# Patient Record
Sex: Female | Born: 1970 | Race: Black or African American | Hispanic: No | Marital: Single | Smoking: Never smoker
Health system: Southern US, Community
[De-identification: ages and names within clinical notes are randomized; demographics above are authoritative.]

## PROBLEM LIST (undated history)

## (undated) DIAGNOSIS — D509 Iron deficiency anemia, unspecified: Secondary | ICD-10-CM

## (undated) DIAGNOSIS — G589 Mononeuropathy, unspecified: Secondary | ICD-10-CM

## (undated) DIAGNOSIS — I10 Essential (primary) hypertension: Secondary | ICD-10-CM

## (undated) DIAGNOSIS — K219 Gastro-esophageal reflux disease without esophagitis: Secondary | ICD-10-CM

## (undated) DIAGNOSIS — E119 Type 2 diabetes mellitus without complications: Secondary | ICD-10-CM

## (undated) HISTORY — DX: Type 2 diabetes mellitus without complications: E11.9

## (undated) HISTORY — DX: Mononeuropathy, unspecified: G58.9

## (undated) HISTORY — DX: Gastro-esophageal reflux disease without esophagitis: K21.9

## (undated) HISTORY — DX: Iron deficiency anemia, unspecified: D50.9

## (undated) HISTORY — DX: Essential (primary) hypertension: I10

## (undated) HISTORY — PX: OOPHORECTOMY: SHX86

---

## 2014-12-15 ENCOUNTER — Other Ambulatory Visit: Payer: Self-pay | Admitting: Family Medicine

## 2014-12-15 DIAGNOSIS — R221 Localized swelling, mass and lump, neck: Secondary | ICD-10-CM

## 2014-12-20 ENCOUNTER — Ambulatory Visit
Admission: RE | Admit: 2014-12-20 | Discharge: 2014-12-20 | Disposition: A | Discharge status: Home / Self Care | Payer: BLUE CROSS/BLUE SHIELD | Source: Ambulatory Visit | Attending: Family Medicine | Admitting: Family Medicine

## 2014-12-20 DIAGNOSIS — R221 Localized swelling, mass and lump, neck: Secondary | ICD-10-CM

## 2014-12-28 ENCOUNTER — Other Ambulatory Visit: Payer: Self-pay | Admitting: Family Medicine

## 2014-12-28 DIAGNOSIS — E041 Nontoxic single thyroid nodule: Secondary | ICD-10-CM

## 2014-12-29 ENCOUNTER — Ambulatory Visit
Admission: RE | Admit: 2014-12-29 | Discharge: 2014-12-29 | Disposition: A | Discharge status: Home / Self Care | Payer: BLUE CROSS/BLUE SHIELD | Source: Ambulatory Visit | Attending: Family Medicine | Admitting: Family Medicine

## 2014-12-29 ENCOUNTER — Other Ambulatory Visit (HOSPITAL_COMMUNITY)
Admission: RE | Admit: 2014-12-29 | Discharge: 2014-12-29 | Disposition: A | Discharge status: Home / Self Care | Payer: BLUE CROSS/BLUE SHIELD | Source: Ambulatory Visit | Attending: Diagnostic Radiology | Admitting: Diagnostic Radiology

## 2014-12-29 DIAGNOSIS — E041 Nontoxic single thyroid nodule: Secondary | ICD-10-CM | POA: Diagnosis not present

## 2015-01-18 ENCOUNTER — Other Ambulatory Visit: Payer: Self-pay | Admitting: Surgery

## 2015-01-18 DIAGNOSIS — E041 Nontoxic single thyroid nodule: Secondary | ICD-10-CM

## 2015-07-12 ENCOUNTER — Other Ambulatory Visit: Payer: Self-pay | Admitting: Surgery

## 2015-07-12 DIAGNOSIS — E042 Nontoxic multinodular goiter: Secondary | ICD-10-CM

## 2015-07-19 ENCOUNTER — Other Ambulatory Visit: Payer: BLUE CROSS/BLUE SHIELD

## 2016-01-24 IMAGING — US US THYROID BIOPSY
1 series · 11 of 11 positions shown · non-contrast
Comparison: 12/20/2014

CLINICAL DATA: 43-year-old with a dominant right thyroid nodule.

EXAM:
ULTRASOUND GUIDED NEEDLE ASPIRATE BIOPSY OF THE THYROID GLAND

[Series 1: us thyroid biopsy · 0.08mm/px · 11 acquisitions, 11 frames shown]
[im 1/11]
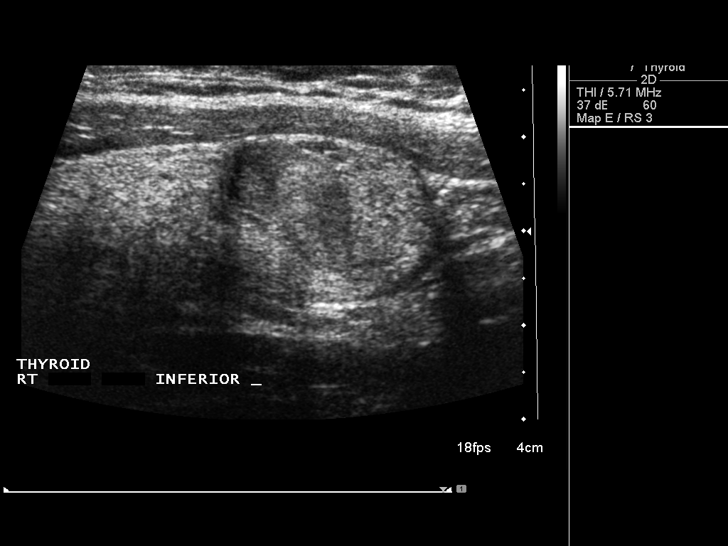
[im 2/11]
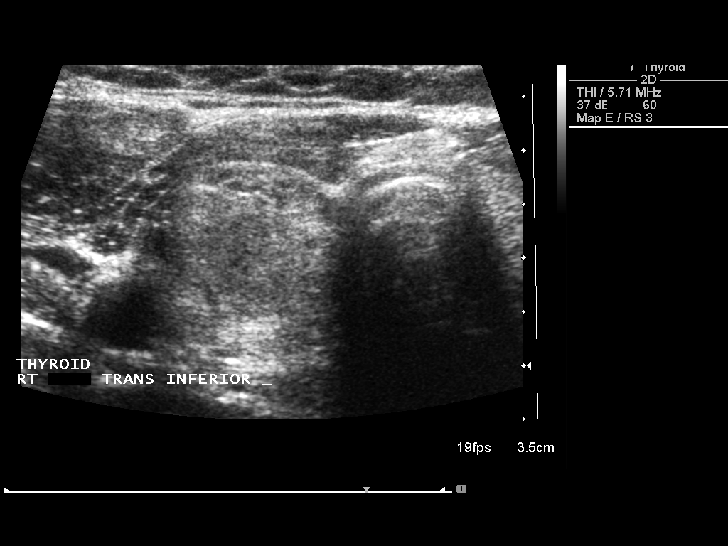
[im 3/11]
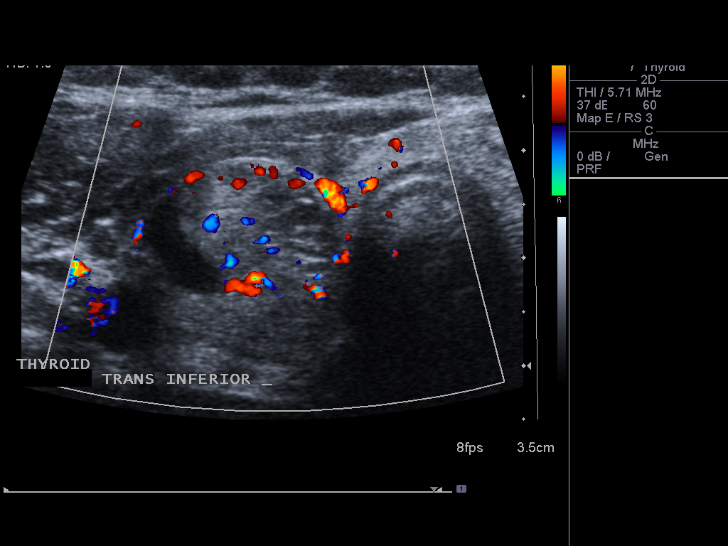
[im 4/11]
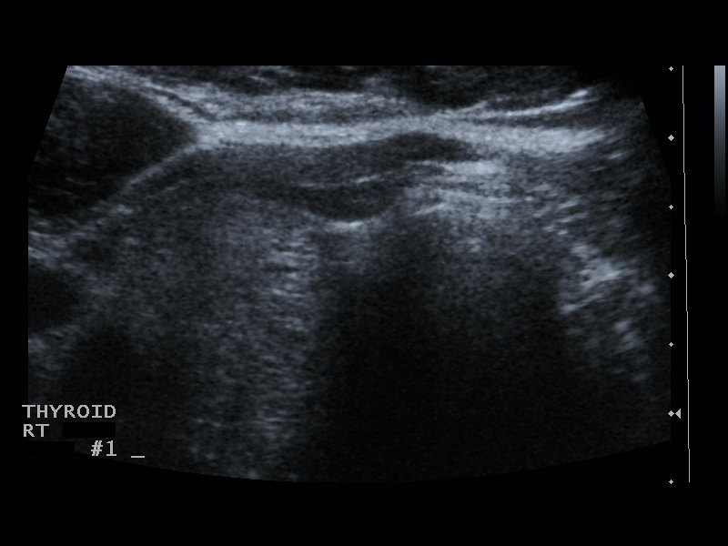
[im 5/11]
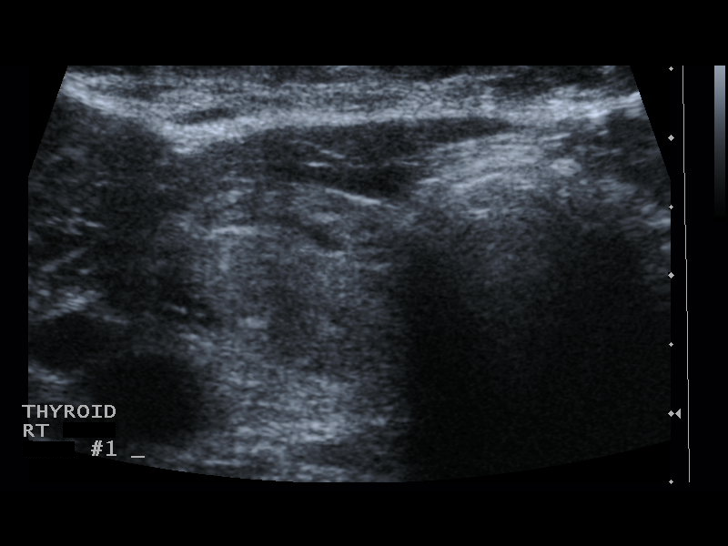
[im 6/11]
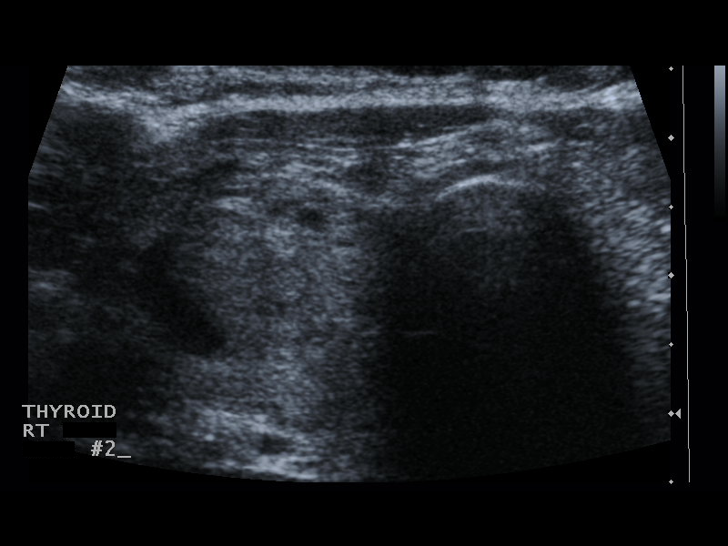
[im 7/11]
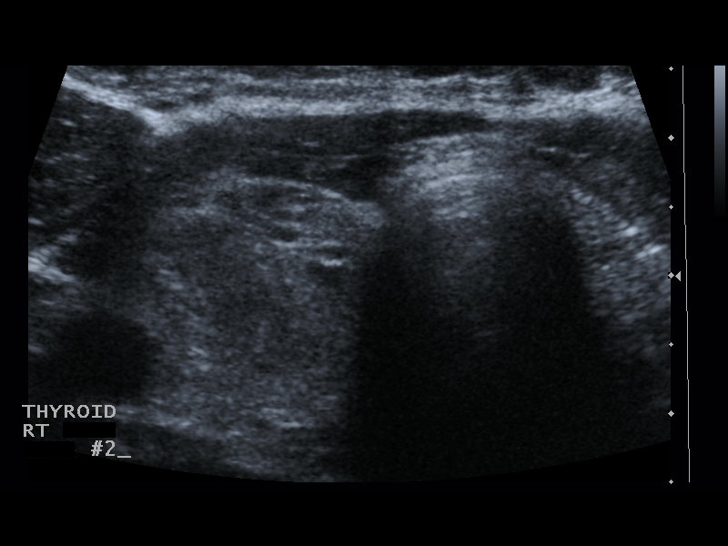
[im 8/11]
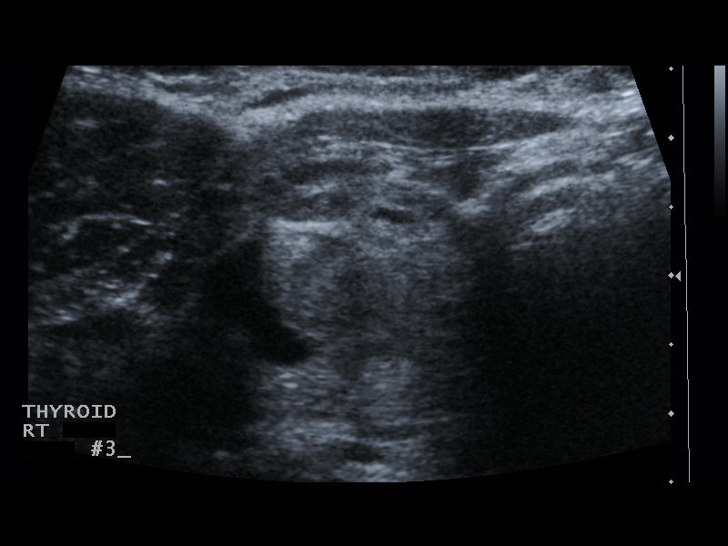
[im 9/11]
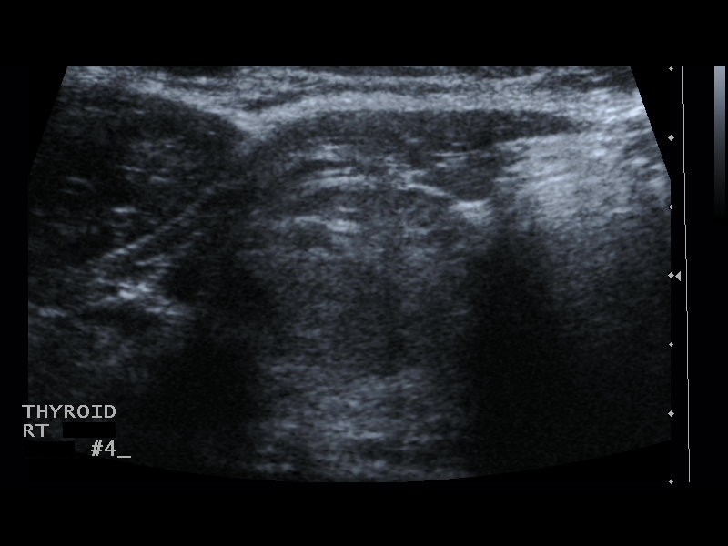
[im 10/11]
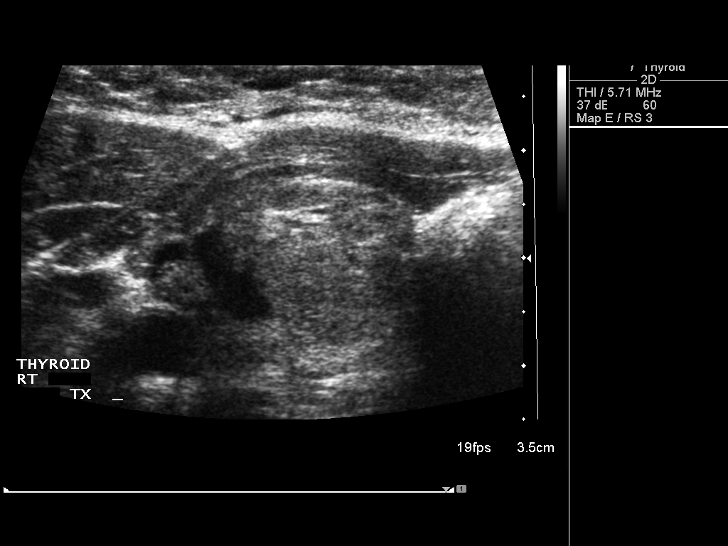
[im 11/11]
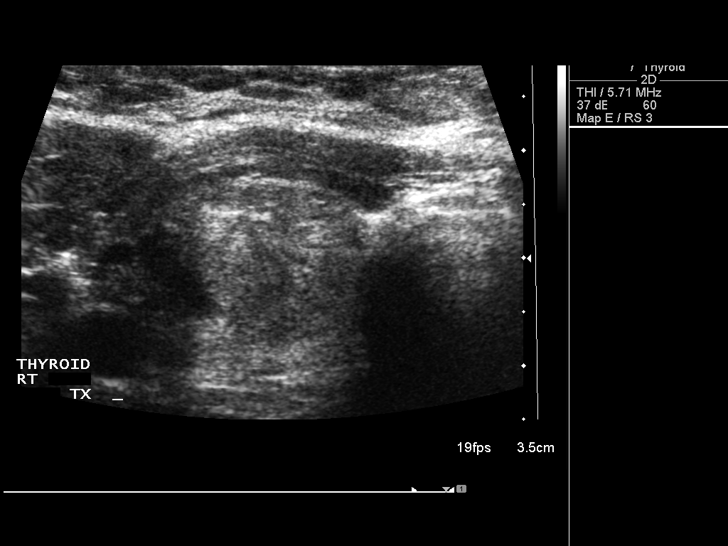

[11 of 11 positions shown; findings below may reference images not displayed]

PROCEDURE:
Thyroid biopsy was thoroughly discussed with the patient and
questions were answered. The benefits, risks, alternatives, and
complications were also discussed. The patient understands and
wishes to proceed with the procedure. Written consent was obtained.

Ultrasound was performed to localize and mark an adequate site for
the biopsy. The patient was then prepped and draped in a normal
sterile fashion. Local anesthesia was provided with 1% lidocaine.
Using direct ultrasound guidance, 4 passes were made using needles
into the nodule within the right lobe of the thyroid. Ultrasound was
used to confirm needle placements on all occasions. Specimens were
sent to Pathology for analysis.

Complications:  None

Estimated blood loss: Minimal
FINDINGS: Heterogeneous dominant nodule in the inferior right thyroid lobe.
IMPRESSION: Ultrasound guided needle aspirate biopsy performed of the right
dominant thyroid nodule.

## 2016-07-19 DIAGNOSIS — I10 Essential (primary) hypertension: Secondary | ICD-10-CM | POA: Diagnosis not present

## 2016-07-19 DIAGNOSIS — E559 Vitamin D deficiency, unspecified: Secondary | ICD-10-CM | POA: Diagnosis not present

## 2016-07-19 DIAGNOSIS — E1169 Type 2 diabetes mellitus with other specified complication: Secondary | ICD-10-CM | POA: Diagnosis not present

## 2016-07-19 DIAGNOSIS — D649 Anemia, unspecified: Secondary | ICD-10-CM | POA: Diagnosis not present

## 2016-08-22 DIAGNOSIS — J029 Acute pharyngitis, unspecified: Secondary | ICD-10-CM | POA: Diagnosis not present

## 2016-08-22 DIAGNOSIS — J101 Influenza due to other identified influenza virus with other respiratory manifestations: Secondary | ICD-10-CM | POA: Diagnosis not present

## 2016-10-22 DIAGNOSIS — J4520 Mild intermittent asthma, uncomplicated: Secondary | ICD-10-CM | POA: Diagnosis not present

## 2016-10-22 DIAGNOSIS — J069 Acute upper respiratory infection, unspecified: Secondary | ICD-10-CM | POA: Diagnosis not present

## 2016-10-30 DIAGNOSIS — Z1231 Encounter for screening mammogram for malignant neoplasm of breast: Secondary | ICD-10-CM | POA: Diagnosis not present

## 2016-11-08 DIAGNOSIS — R509 Fever, unspecified: Secondary | ICD-10-CM | POA: Diagnosis not present

## 2016-11-08 DIAGNOSIS — R05 Cough: Secondary | ICD-10-CM | POA: Diagnosis not present

## 2016-11-08 DIAGNOSIS — J101 Influenza due to other identified influenza virus with other respiratory manifestations: Secondary | ICD-10-CM | POA: Diagnosis not present

## 2016-11-11 DIAGNOSIS — R05 Cough: Secondary | ICD-10-CM | POA: Diagnosis not present

## 2016-11-11 DIAGNOSIS — J101 Influenza due to other identified influenza virus with other respiratory manifestations: Secondary | ICD-10-CM | POA: Diagnosis not present

## 2016-11-19 DIAGNOSIS — I10 Essential (primary) hypertension: Secondary | ICD-10-CM | POA: Diagnosis not present

## 2016-11-19 DIAGNOSIS — E559 Vitamin D deficiency, unspecified: Secondary | ICD-10-CM | POA: Diagnosis not present

## 2016-11-19 DIAGNOSIS — D649 Anemia, unspecified: Secondary | ICD-10-CM | POA: Diagnosis not present

## 2016-11-19 DIAGNOSIS — E1169 Type 2 diabetes mellitus with other specified complication: Secondary | ICD-10-CM | POA: Diagnosis not present

## 2016-11-25 DIAGNOSIS — Z01419 Encounter for gynecological examination (general) (routine) without abnormal findings: Secondary | ICD-10-CM | POA: Diagnosis not present

## 2016-11-25 DIAGNOSIS — Z6833 Body mass index (BMI) 33.0-33.9, adult: Secondary | ICD-10-CM | POA: Diagnosis not present

## 2016-11-26 ENCOUNTER — Other Ambulatory Visit: Payer: Self-pay | Admitting: Obstetrics and Gynecology

## 2016-11-26 DIAGNOSIS — E041 Nontoxic single thyroid nodule: Secondary | ICD-10-CM

## 2016-12-03 ENCOUNTER — Ambulatory Visit
Admission: RE | Admit: 2016-12-03 | Discharge: 2016-12-03 | Disposition: A | Discharge status: Home / Self Care | Payer: BLUE CROSS/BLUE SHIELD | Source: Ambulatory Visit | Attending: Obstetrics and Gynecology | Admitting: Obstetrics and Gynecology

## 2016-12-03 DIAGNOSIS — E041 Nontoxic single thyroid nodule: Secondary | ICD-10-CM | POA: Diagnosis not present

## 2016-12-24 DIAGNOSIS — D649 Anemia, unspecified: Secondary | ICD-10-CM | POA: Diagnosis not present

## 2017-02-25 DIAGNOSIS — E1169 Type 2 diabetes mellitus with other specified complication: Secondary | ICD-10-CM | POA: Diagnosis not present

## 2017-02-25 DIAGNOSIS — D649 Anemia, unspecified: Secondary | ICD-10-CM | POA: Diagnosis not present

## 2017-02-25 DIAGNOSIS — E559 Vitamin D deficiency, unspecified: Secondary | ICD-10-CM | POA: Diagnosis not present

## 2017-02-25 DIAGNOSIS — I10 Essential (primary) hypertension: Secondary | ICD-10-CM | POA: Diagnosis not present

## 2017-04-21 DIAGNOSIS — D649 Anemia, unspecified: Secondary | ICD-10-CM | POA: Diagnosis not present

## 2017-04-21 DIAGNOSIS — I10 Essential (primary) hypertension: Secondary | ICD-10-CM | POA: Diagnosis not present

## 2017-04-21 DIAGNOSIS — Z0000 Encounter for general adult medical examination without abnormal findings: Secondary | ICD-10-CM | POA: Diagnosis not present

## 2017-04-21 DIAGNOSIS — Z23 Encounter for immunization: Secondary | ICD-10-CM | POA: Diagnosis not present

## 2017-04-21 DIAGNOSIS — Z13220 Encounter for screening for lipoid disorders: Secondary | ICD-10-CM | POA: Diagnosis not present

## 2017-08-04 DIAGNOSIS — R7303 Prediabetes: Secondary | ICD-10-CM | POA: Diagnosis not present

## 2017-08-04 DIAGNOSIS — D649 Anemia, unspecified: Secondary | ICD-10-CM | POA: Diagnosis not present

## 2017-08-04 DIAGNOSIS — E559 Vitamin D deficiency, unspecified: Secondary | ICD-10-CM | POA: Diagnosis not present

## 2017-08-04 DIAGNOSIS — E669 Obesity, unspecified: Secondary | ICD-10-CM | POA: Diagnosis not present

## 2017-08-04 DIAGNOSIS — I10 Essential (primary) hypertension: Secondary | ICD-10-CM | POA: Diagnosis not present

## 2017-09-25 IMAGING — US US THYROID
1 series · 13 of 25 positions shown · non-contrast
Comparison: Prior thyroid ultrasound 12/20/2014; prior thyroid
ultrasound biopsy images 6 [DATE]

CLINICAL DATA: Prior ultrasound follow-up. 45-year-old female with
a history of thyroid nodules. She underwent ultrasound-guided biopsy
right inferior thyroid nodule on 12/29/2014.

EXAM:
THYROID ULTRASOUND
TECHNIQUE: Ultrasound examination of the thyroid gland and adjacent soft
tissues was performed.

[Series 1: us thyroid · 0.07mm/px · 13 of 54 slices shown]
[im 1/54]
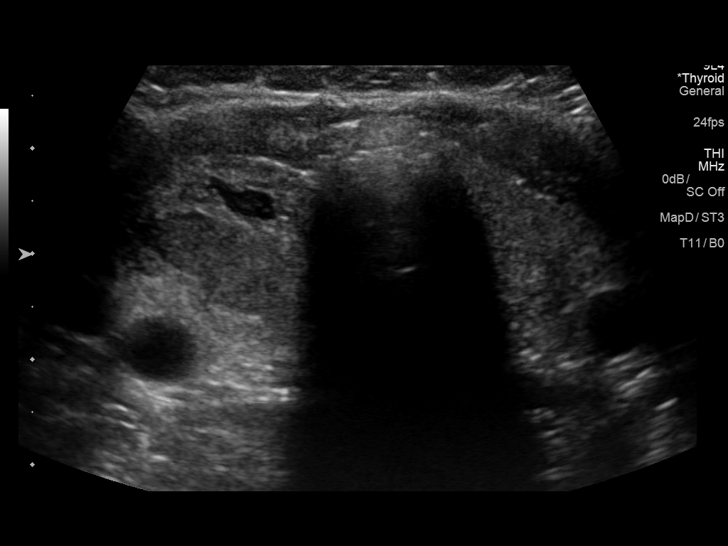
[im 5/54]
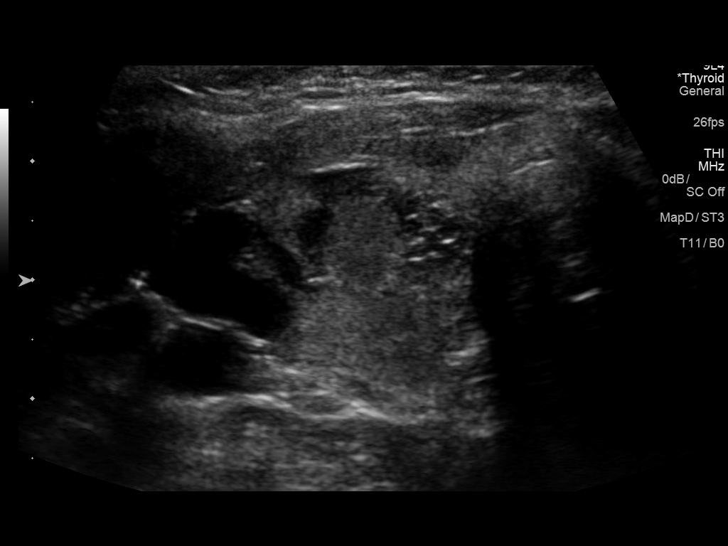
[im 9/54]
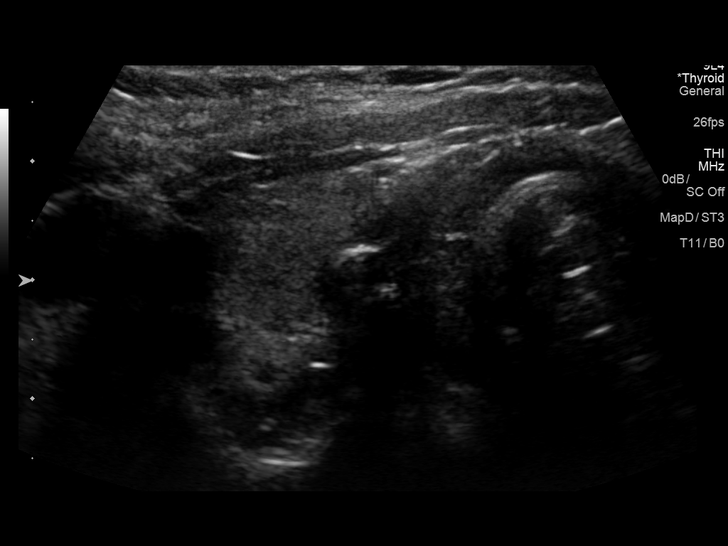
[im 14/54]
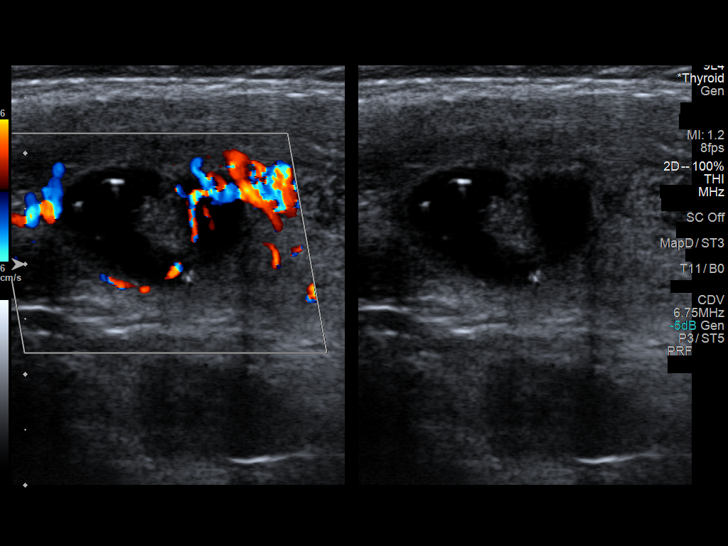
[im 18/54]
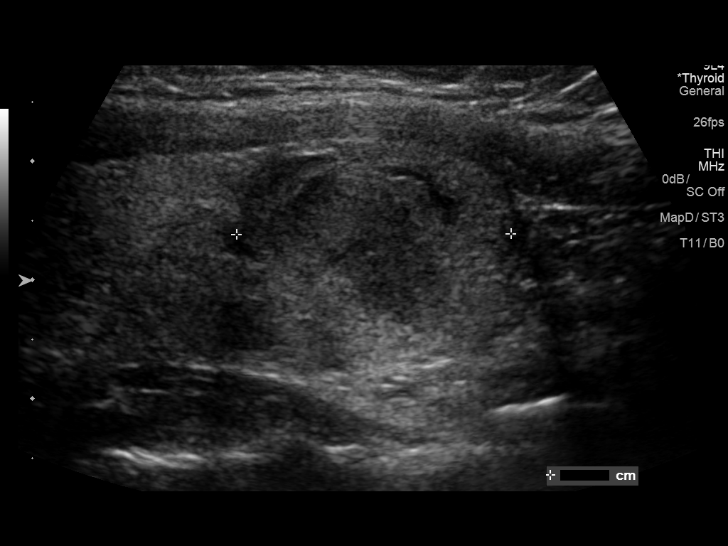
[im 23/54]
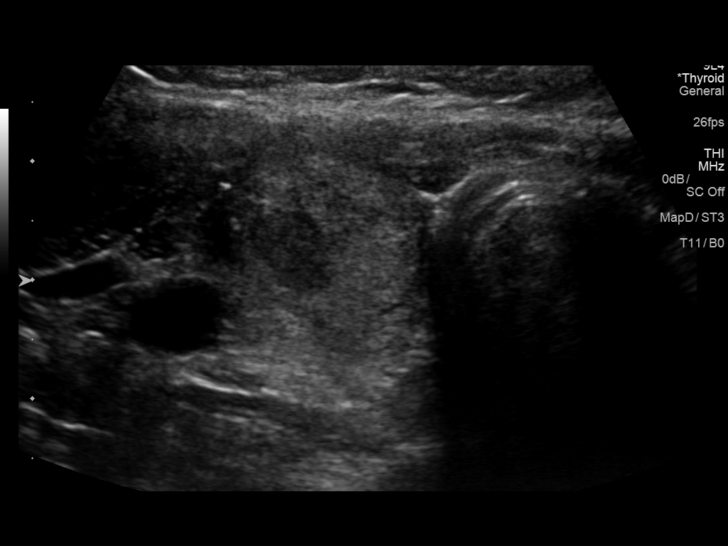
[im 27/54]
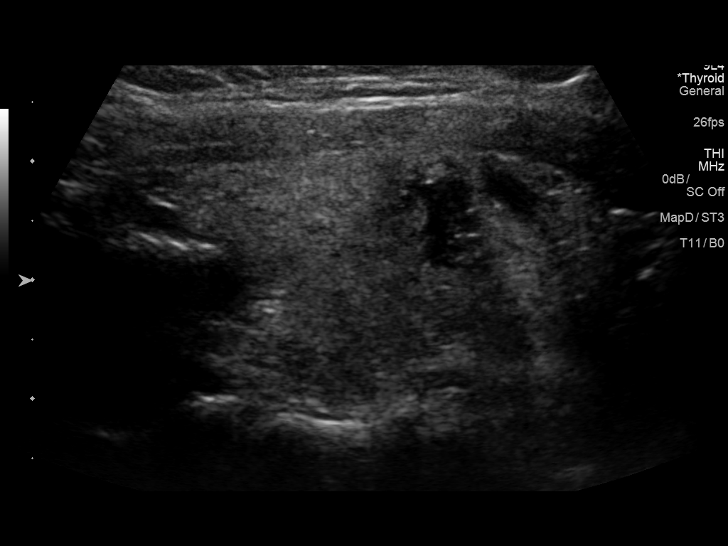
[im 31/54]
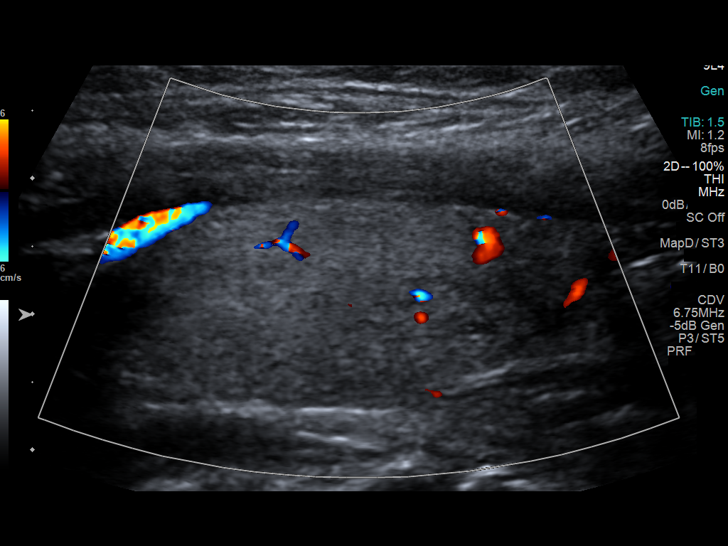
[im 36/54]
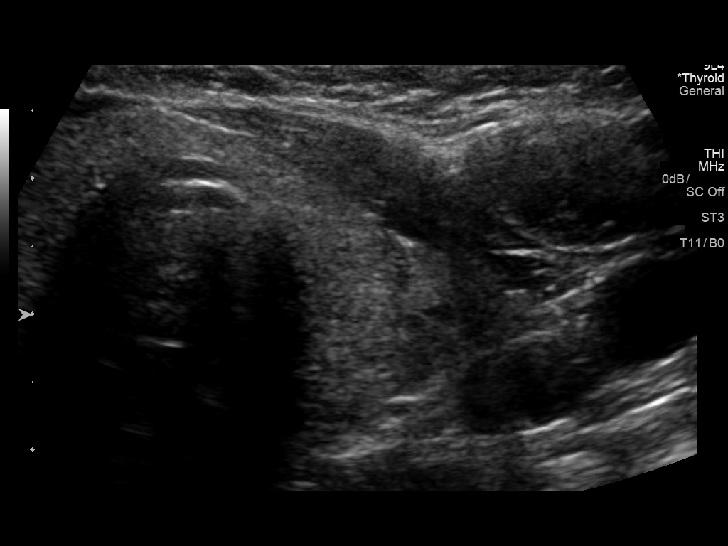
[im 40/54]
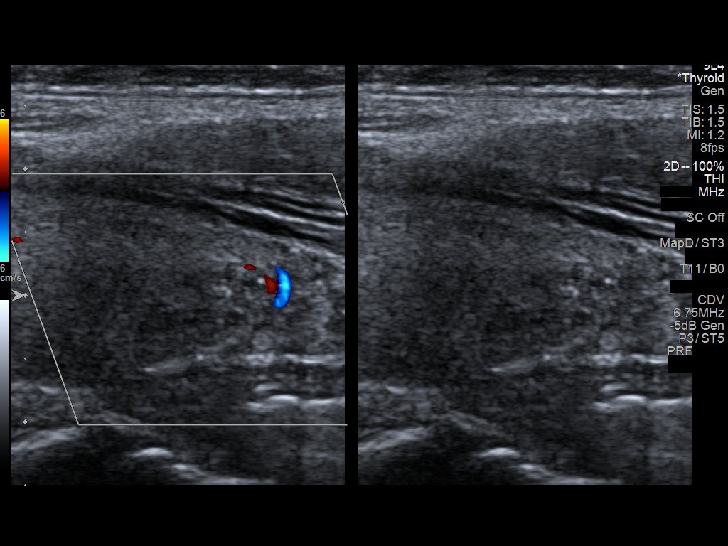
[im 45/54]
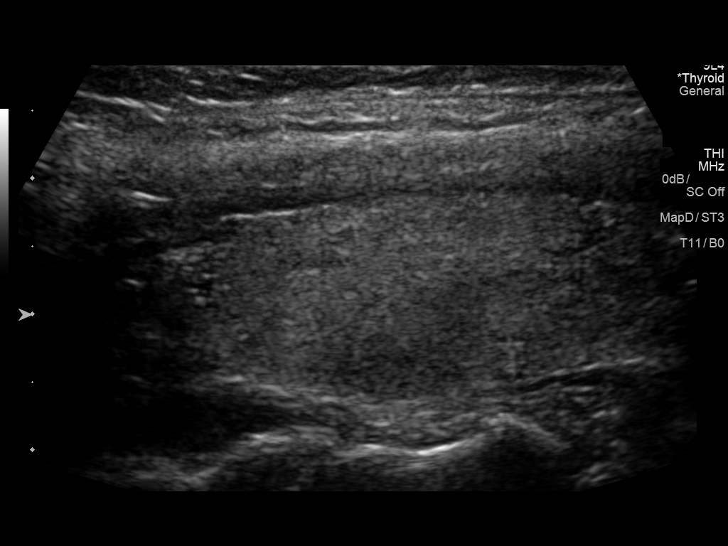
[im 49/54]
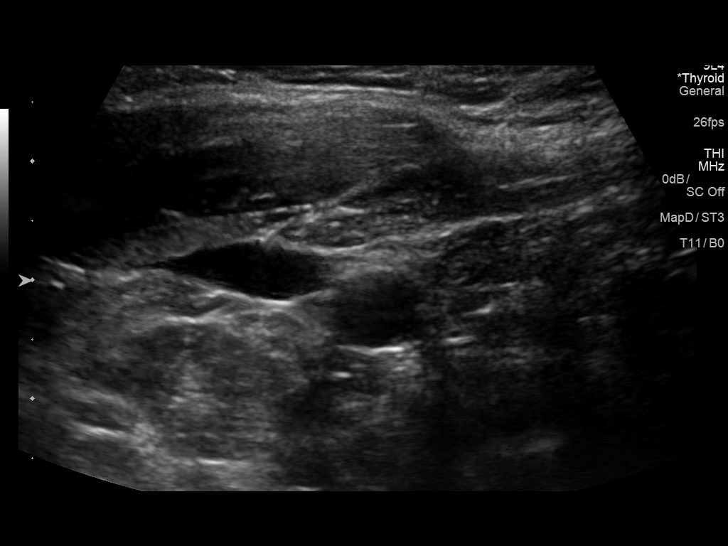
[im 54/54]
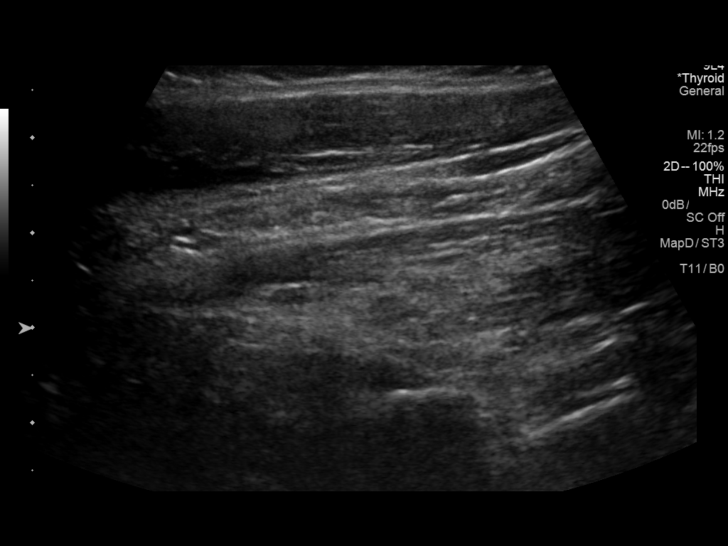

[13 of 25 positions shown; findings below may reference images not displayed]

FINDINGS: Parenchymal Echotexture: Mildly heterogenous

Isthmus: 0.5 cm

Right lobe: 5.0 x 2.1 x 2.8 cm

Left lobe: 4.1 x 1.4 x 1.7 cm

_________________________________________________________

Estimated total number of nodules >/= 1 cm: 2

Number of spongiform nodules >/=  2 cm not described below (TR1): 0

Number of mixed cystic and solid nodules >/= 1.5 cm not described
below (TR2): 0

_________________________________________________________

Nodule # 1:

Prior biopsy: No

Location: Right; Mid

Maximum size: 1.7 cm; Other 2 dimensions: 1.1 x 1.1 cm, previously,
1.0 x 0.9 x 0.7 cm

Composition: mixed cystic and solid (1)

Echogenicity: isoechoic (1)

Shape: not taller-than-wide (0)

Margins: smooth (0)

Echogenic foci: large comet-tail artifacts (0)

ACR TI-RADS total points: 2.

ACR TI-RADS risk category:  TR2 (2 points).

Significant change in size (>/= 20% in two dimensions and minimal
increase of 2 mm): Yes

Change in features: No

Change in ACR TI-RADS risk category: No

ACR TI-RADS recommendations:

This nodule does NOT meet TI-RADS criteria for biopsy or dedicated
follow-up.

_________________________________________________________

The previously biopsied nodule in the right inferior gland remains
unchanged at 2.3 cm in maximal dimension. Incidentally, there is a
small 0.9 cm nodule with peripheral colloid artifact in the left
lower pole which also remains unchanged and does not meet criteria
for dedicated follow-up.
IMPRESSION: Stable previously biopsied right lower pole thyroid nodule.

The above is in keeping with the ACR TI-RADS recommendations - [HOSPITAL] 3624;[DATE].

## 2017-12-02 DIAGNOSIS — I10 Essential (primary) hypertension: Secondary | ICD-10-CM | POA: Diagnosis not present

## 2017-12-02 DIAGNOSIS — D649 Anemia, unspecified: Secondary | ICD-10-CM | POA: Diagnosis not present

## 2017-12-02 DIAGNOSIS — E669 Obesity, unspecified: Secondary | ICD-10-CM | POA: Diagnosis not present

## 2017-12-02 DIAGNOSIS — R7303 Prediabetes: Secondary | ICD-10-CM | POA: Diagnosis not present

## 2017-12-02 DIAGNOSIS — E559 Vitamin D deficiency, unspecified: Secondary | ICD-10-CM | POA: Diagnosis not present

## 2018-01-21 DIAGNOSIS — Z1231 Encounter for screening mammogram for malignant neoplasm of breast: Secondary | ICD-10-CM | POA: Diagnosis not present

## 2018-01-21 DIAGNOSIS — Z6836 Body mass index (BMI) 36.0-36.9, adult: Secondary | ICD-10-CM | POA: Diagnosis not present

## 2018-01-21 DIAGNOSIS — Z01419 Encounter for gynecological examination (general) (routine) without abnormal findings: Secondary | ICD-10-CM | POA: Diagnosis not present

## 2018-03-16 DIAGNOSIS — E559 Vitamin D deficiency, unspecified: Secondary | ICD-10-CM | POA: Diagnosis not present

## 2018-03-16 DIAGNOSIS — D649 Anemia, unspecified: Secondary | ICD-10-CM | POA: Diagnosis not present

## 2018-03-16 DIAGNOSIS — I10 Essential (primary) hypertension: Secondary | ICD-10-CM | POA: Diagnosis not present

## 2018-07-01 DIAGNOSIS — J029 Acute pharyngitis, unspecified: Secondary | ICD-10-CM | POA: Diagnosis not present

## 2018-07-01 DIAGNOSIS — J069 Acute upper respiratory infection, unspecified: Secondary | ICD-10-CM | POA: Diagnosis not present

## 2019-02-10 DIAGNOSIS — Z03818 Encounter for observation for suspected exposure to other biological agents ruled out: Secondary | ICD-10-CM | POA: Diagnosis not present

## 2019-02-11 DIAGNOSIS — U071 COVID-19: Secondary | ICD-10-CM | POA: Diagnosis not present

## 2019-02-17 ENCOUNTER — Other Ambulatory Visit: Payer: Self-pay

## 2019-02-17 DIAGNOSIS — Z20822 Contact with and (suspected) exposure to covid-19: Secondary | ICD-10-CM

## 2019-02-21 LAB — NOVEL CORONAVIRUS, NAA: SARS-CoV-2, NAA: NOT DETECTED

## 2019-04-01 DIAGNOSIS — Z01419 Encounter for gynecological examination (general) (routine) without abnormal findings: Secondary | ICD-10-CM | POA: Diagnosis not present

## 2019-04-01 DIAGNOSIS — Z1231 Encounter for screening mammogram for malignant neoplasm of breast: Secondary | ICD-10-CM | POA: Diagnosis not present

## 2019-04-01 DIAGNOSIS — Z6838 Body mass index (BMI) 38.0-38.9, adult: Secondary | ICD-10-CM | POA: Diagnosis not present

## 2019-04-08 ENCOUNTER — Other Ambulatory Visit: Payer: Self-pay | Admitting: Obstetrics and Gynecology

## 2019-04-08 DIAGNOSIS — E041 Nontoxic single thyroid nodule: Secondary | ICD-10-CM

## 2019-04-22 ENCOUNTER — Ambulatory Visit
Admission: RE | Admit: 2019-04-22 | Discharge: 2019-04-22 | Disposition: A | Discharge status: Home / Self Care | Payer: BLUE CROSS/BLUE SHIELD | Source: Ambulatory Visit | Attending: Obstetrics and Gynecology | Admitting: Obstetrics and Gynecology

## 2019-04-22 DIAGNOSIS — E041 Nontoxic single thyroid nodule: Secondary | ICD-10-CM

## 2019-06-07 DIAGNOSIS — E042 Nontoxic multinodular goiter: Secondary | ICD-10-CM | POA: Diagnosis not present

## 2019-06-07 DIAGNOSIS — E041 Nontoxic single thyroid nodule: Secondary | ICD-10-CM | POA: Diagnosis not present

## 2019-07-27 DIAGNOSIS — M7661 Achilles tendinitis, right leg: Secondary | ICD-10-CM | POA: Diagnosis not present

## 2019-11-17 DIAGNOSIS — M5441 Lumbago with sciatica, right side: Secondary | ICD-10-CM | POA: Diagnosis not present

## 2019-11-25 DIAGNOSIS — M5416 Radiculopathy, lumbar region: Secondary | ICD-10-CM | POA: Diagnosis not present

## 2019-11-25 DIAGNOSIS — R030 Elevated blood-pressure reading, without diagnosis of hypertension: Secondary | ICD-10-CM | POA: Diagnosis not present

## 2019-11-25 DIAGNOSIS — G959 Disease of spinal cord, unspecified: Secondary | ICD-10-CM | POA: Diagnosis not present

## 2019-11-25 DIAGNOSIS — R29898 Other symptoms and signs involving the musculoskeletal system: Secondary | ICD-10-CM | POA: Diagnosis not present

## 2019-12-14 DIAGNOSIS — I10 Essential (primary) hypertension: Secondary | ICD-10-CM | POA: Diagnosis not present

## 2019-12-14 DIAGNOSIS — R29898 Other symptoms and signs involving the musculoskeletal system: Secondary | ICD-10-CM | POA: Diagnosis not present

## 2019-12-14 DIAGNOSIS — M5416 Radiculopathy, lumbar region: Secondary | ICD-10-CM | POA: Diagnosis not present

## 2019-12-14 DIAGNOSIS — G959 Disease of spinal cord, unspecified: Secondary | ICD-10-CM | POA: Diagnosis not present

## 2020-01-24 DIAGNOSIS — I10 Essential (primary) hypertension: Secondary | ICD-10-CM | POA: Diagnosis not present

## 2020-01-24 DIAGNOSIS — R202 Paresthesia of skin: Secondary | ICD-10-CM | POA: Diagnosis not present

## 2020-01-24 DIAGNOSIS — M5416 Radiculopathy, lumbar region: Secondary | ICD-10-CM | POA: Diagnosis not present

## 2020-01-24 DIAGNOSIS — R7303 Prediabetes: Secondary | ICD-10-CM | POA: Diagnosis not present

## 2020-03-27 DIAGNOSIS — E559 Vitamin D deficiency, unspecified: Secondary | ICD-10-CM | POA: Diagnosis not present

## 2020-03-27 DIAGNOSIS — E1169 Type 2 diabetes mellitus with other specified complication: Secondary | ICD-10-CM | POA: Diagnosis not present

## 2020-03-27 DIAGNOSIS — M5416 Radiculopathy, lumbar region: Secondary | ICD-10-CM | POA: Diagnosis not present

## 2020-03-27 DIAGNOSIS — E669 Obesity, unspecified: Secondary | ICD-10-CM | POA: Diagnosis not present

## 2020-03-27 DIAGNOSIS — Z5181 Encounter for therapeutic drug level monitoring: Secondary | ICD-10-CM | POA: Diagnosis not present

## 2020-03-27 DIAGNOSIS — I10 Essential (primary) hypertension: Secondary | ICD-10-CM | POA: Diagnosis not present

## 2020-03-27 DIAGNOSIS — Z20822 Contact with and (suspected) exposure to covid-19: Secondary | ICD-10-CM | POA: Diagnosis not present

## 2020-04-07 ENCOUNTER — Encounter (HOSPITAL_BASED_OUTPATIENT_CLINIC_OR_DEPARTMENT_OTHER): Payer: Self-pay | Admitting: Emergency Medicine

## 2020-04-07 ENCOUNTER — Emergency Department (HOSPITAL_BASED_OUTPATIENT_CLINIC_OR_DEPARTMENT_OTHER)
Admission: EM | Admit: 2020-04-07 | Discharge: 2020-04-08 | Disposition: A | Discharge status: Home / Self Care | Payer: BC Managed Care – PPO | Attending: Emergency Medicine | Admitting: Emergency Medicine

## 2020-04-07 ENCOUNTER — Other Ambulatory Visit: Payer: Self-pay

## 2020-04-07 ENCOUNTER — Emergency Department (HOSPITAL_BASED_OUTPATIENT_CLINIC_OR_DEPARTMENT_OTHER): Payer: BC Managed Care – PPO

## 2020-04-07 DIAGNOSIS — M25561 Pain in right knee: Secondary | ICD-10-CM | POA: Diagnosis not present

## 2020-04-07 DIAGNOSIS — M79604 Pain in right leg: Secondary | ICD-10-CM | POA: Diagnosis not present

## 2020-04-07 DIAGNOSIS — M79661 Pain in right lower leg: Secondary | ICD-10-CM

## 2020-04-07 DIAGNOSIS — R600 Localized edema: Secondary | ICD-10-CM | POA: Diagnosis not present

## 2020-04-07 DIAGNOSIS — M7989 Other specified soft tissue disorders: Secondary | ICD-10-CM | POA: Diagnosis not present

## 2020-04-07 NOTE — ED Triage Notes (Signed)
Pt reports elevated D-dimer at PCP today; referred here for [place] to r/o DVT; c/o RLE pain since last Fri

## 2020-04-07 NOTE — ED Notes (Signed)
Last Friday began having RLE pain, saw primary MD who informed her that she had an elevated D Dimer. Has increase pain with walking. Pt states has pain with plantar flexion of RLE and denies any pain in calf with dorsal flexion, obvious swelling noted, pitting edema noted at Rt ankle area.

## 2020-04-08 NOTE — ED Provider Notes (Signed)
[facility] Provider Note: [name], MD, FACEP  CSN: [id number] MRN: [id number] ARRIVAL: 04/07/20 at 1932 ROOM: [facility]   CHIEF COMPLAINT  Leg Pain   HISTORY OF PRESENT ILLNESS  04/08/20 1:48 AM Amanda Underwood is a 49 y.o. female with right lower extremity pain and swelling for the past 8 days.  She does not know what may of triggered this as she had no trauma.  Symptoms have improved over the past week but still persist.  She rates her current pain as a 4 out of 10, worse with ambulation or palpation.  She was seen by her PCP yesterday and told that her D-dimer was elevated.  She was sent here for Doppler ultrasound which is negative for pulmonary embolism.  She has been taking tramadol with partial relief of the pain.  She has an NSAID prescription written by her PCP which she has not yet picked up.  She denies chest pain or shortness of breath.  History reviewed. No pertinent past medical history.  History reviewed. No pertinent surgical history.  No family history on file.  Social History   Tobacco Use  . Smoking status: Never Smoker  . Smokeless tobacco: Never Used  Substance Use Topics  . Alcohol use: Yes    Comment: occ  . Drug use: Not Currently    Prior to Admission medications   Not on File    Allergies Patient has no known allergies.   REVIEW OF SYSTEMS  Negative except as noted here or in the History of Present Illness.   PHYSICAL EXAMINATION  Initial Vital Signs Blood pressure 127/75, pulse 78, temperature 98.8 F (37.1 C), temperature source Oral, resp. rate 20, height 5\' 3"  (1.6 m), weight 91.6 kg, last menstrual period 03/15/2020, SpO2 100 %.  Examination General: Well-developed, well-nourished female in no acute distress; appears younger than age of record HENT: normocephalic; atraumatic Eyes: Normal appearance Neck: supple Heart: regular rate and rhythm Lungs: clear to auscultation bilaterally Abdomen: soft; nondistended;  nontender; bowel sounds present Extremities: No deformity; full range of motion; pulses normal; mild edema and tenderness of right lower extremity Neurologic: Awake, alert and oriented; motor function intact in all extremities and symmetric; no facial droop Skin: Warm and dry Psychiatric: Normal mood and affect   RESULTS  Summary of this visit's results, reviewed and interpreted by myself:   EKG Interpretation  Date/Time:    Ventricular Rate:    PR Interval:    QRS Duration:   QT Interval:    QTC Calculation:   R Axis:     Text Interpretation:        Laboratory Studies: No results found for this or any previous visit (from the past 24 hour(s)). Imaging Studies: 03/17/2020 Venous Img Lower Unilateral Right  Result Date: 04/07/2020 CLINICAL DATA:  Pain and swelling EXAM: RIGHT LOWER EXTREMITY VENOUS DOPPLER ULTRASOUND TECHNIQUE: Gray-scale sonography with compression, as well as color and duplex ultrasound, were performed to evaluate the deep venous system(s) from the level of the common femoral vein through the popliteal and proximal calf veins. COMPARISON:  None. FINDINGS: VENOUS Normal compressibility of the common femoral, superficial femoral, and popliteal veins, as well as the visualized calf veins. Visualized portions of profunda femoral vein and great saphenous vein unremarkable. No filling defects to suggest DVT on grayscale or color Doppler imaging. Doppler waveforms show normal direction of venous flow, normal respiratory plasticity and response to augmentation. Limited views of the contralateral common femoral vein are unremarkable. OTHER Lower  extremity edema is noted at the level of the ankle. Limitations: none IMPRESSION: 1. No DVT. 2. Lower extremity edema is noted at the level ankle. Electronically Signed   By: Katherine Mantle M.D.   On: 04/07/2020 20:42    ED COURSE and MDM  Nursing notes, initial and subsequent vitals signs, including pulse oximetry, reviewed and  interpreted by myself.  Vitals:   04/07/20 1946 04/07/20 1947 04/08/20 0138  BP: 133/79  127/75  Pulse: 98  78  Resp: 18  20  Temp: 98.8 F (37.1 C)    TempSrc: Oral    SpO2: 100%  100%  Weight:  91.6 kg   Height:  5\' 3"  (1.6 m)    Medications - No data to display  Patient declines any pain medication at this time.  As noted above she has an NSAID pending.  The cause of her unilateral edema is not clear but DVT has been eliminated.  PROCEDURES  Procedures   ED DIAGNOSES     ICD-10-CM   1. Pain and swelling of right lower leg  M79.661    M79.89        Efren Kross, , MD 04/08/20 0205

## 2020-04-08 NOTE — ED Notes (Signed)
Rounded on client, updated on wait times, offered comfort measures

## 2020-04-10 DIAGNOSIS — M7989 Other specified soft tissue disorders: Secondary | ICD-10-CM | POA: Diagnosis not present

## 2020-04-10 DIAGNOSIS — I10 Essential (primary) hypertension: Secondary | ICD-10-CM | POA: Diagnosis not present

## 2020-04-10 DIAGNOSIS — M79604 Pain in right leg: Secondary | ICD-10-CM | POA: Diagnosis not present

## 2020-04-10 DIAGNOSIS — E559 Vitamin D deficiency, unspecified: Secondary | ICD-10-CM | POA: Diagnosis not present

## 2020-04-10 DIAGNOSIS — D509 Iron deficiency anemia, unspecified: Secondary | ICD-10-CM | POA: Diagnosis not present

## 2020-04-10 DIAGNOSIS — M25561 Pain in right knee: Secondary | ICD-10-CM | POA: Diagnosis not present

## 2020-04-28 DIAGNOSIS — R200 Anesthesia of skin: Secondary | ICD-10-CM | POA: Diagnosis not present

## 2020-04-28 DIAGNOSIS — D509 Iron deficiency anemia, unspecified: Secondary | ICD-10-CM | POA: Diagnosis not present

## 2020-04-28 DIAGNOSIS — M79604 Pain in right leg: Secondary | ICD-10-CM | POA: Diagnosis not present

## 2020-04-28 DIAGNOSIS — Z5181 Encounter for therapeutic drug level monitoring: Secondary | ICD-10-CM | POA: Diagnosis not present

## 2020-04-28 DIAGNOSIS — M25561 Pain in right knee: Secondary | ICD-10-CM | POA: Diagnosis not present

## 2020-04-28 DIAGNOSIS — M7989 Other specified soft tissue disorders: Secondary | ICD-10-CM | POA: Diagnosis not present

## 2020-04-28 DIAGNOSIS — I10 Essential (primary) hypertension: Secondary | ICD-10-CM | POA: Diagnosis not present

## 2020-04-28 DIAGNOSIS — E559 Vitamin D deficiency, unspecified: Secondary | ICD-10-CM | POA: Diagnosis not present

## 2020-05-17 IMAGING — US US THYROID
1 series · 13 of 25 positions shown · non-contrast
Comparison: Prior ultrasound-guided biopsy 12/29/2014; prior
thyroid ultrasound December 03, 2016

CLINICAL DATA: Goiter. 48-year-old female with a history of thyroid
nodules. She previously underwent biopsy of the right sided thyroid
nodule in Sunday December, 2014.

EXAM:
THYROID ULTRASOUND
TECHNIQUE: Ultrasound examination of the thyroid gland and adjacent soft
tissues was performed.

[Series 1: us thyroid · 0.07mm/px · 13 of 41 slices shown]
[im 1/41]
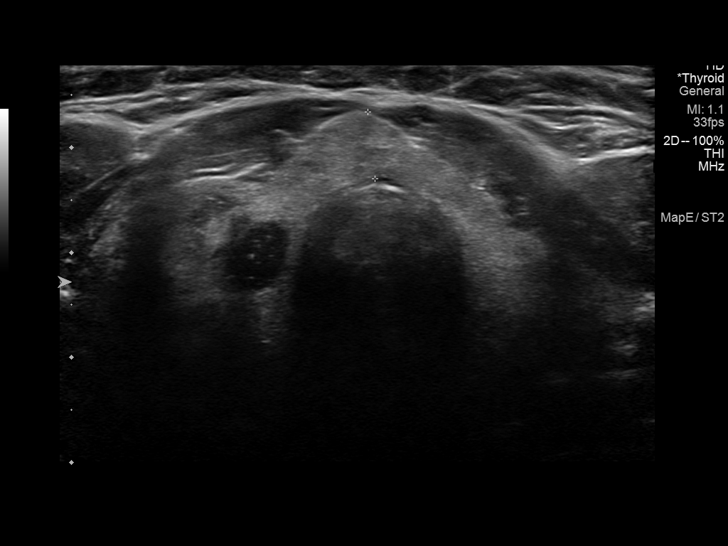
[im 4/41]
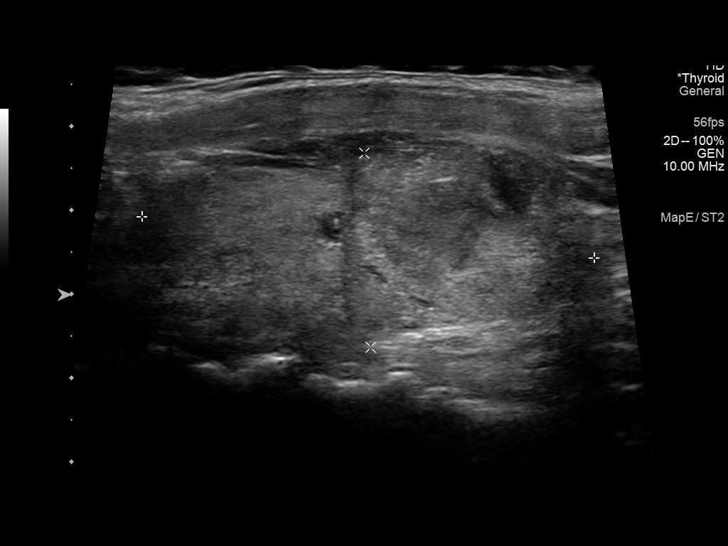
[im 7/41]
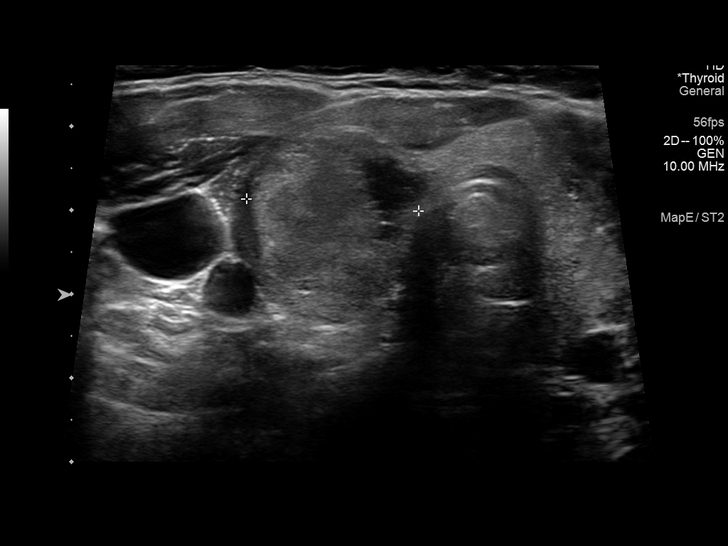
[im 11/41]
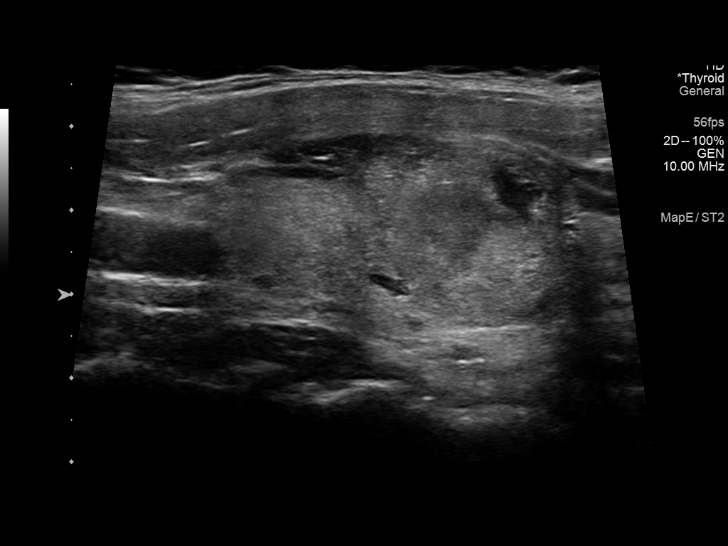
[im 14/41]
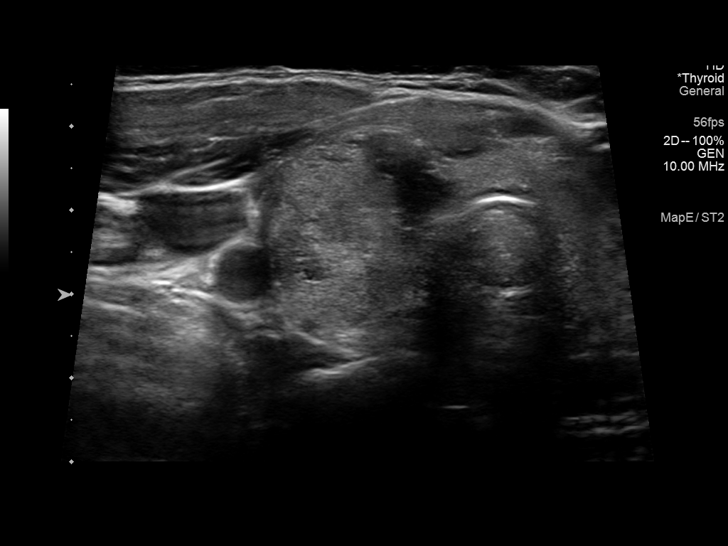
[im 17/41]
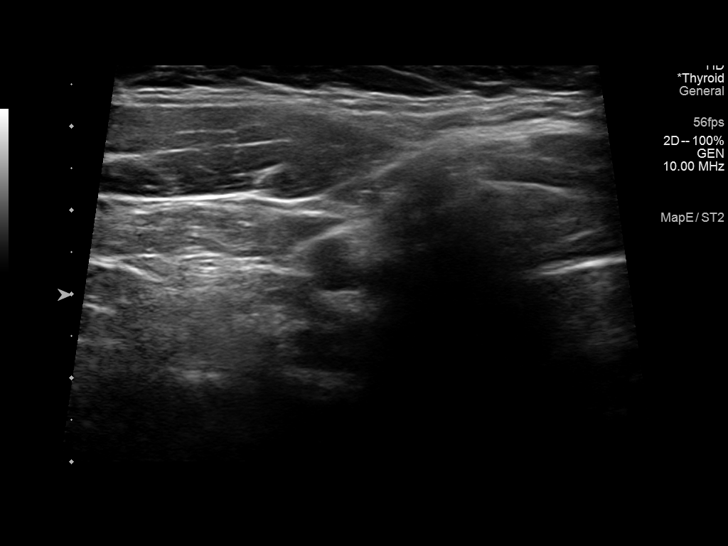
[im 21/41]
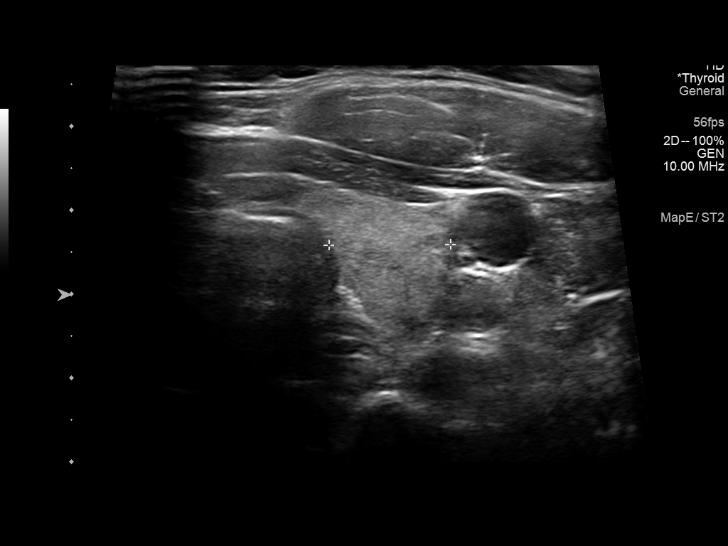
[im 24/41]
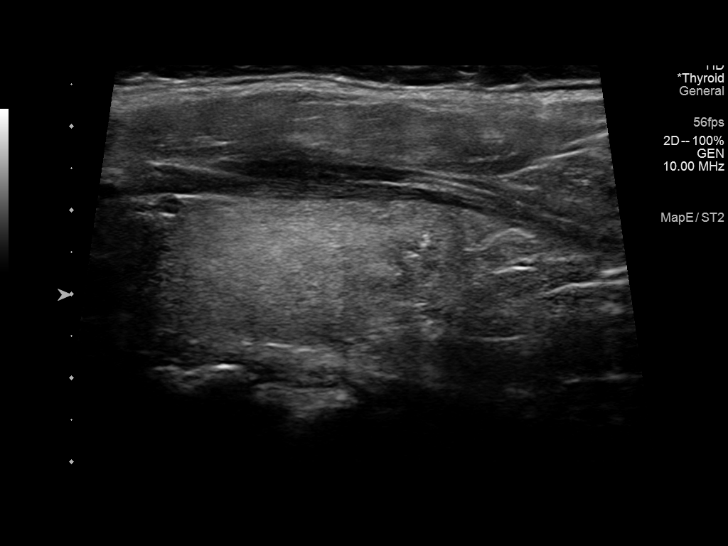
[im 27/41]
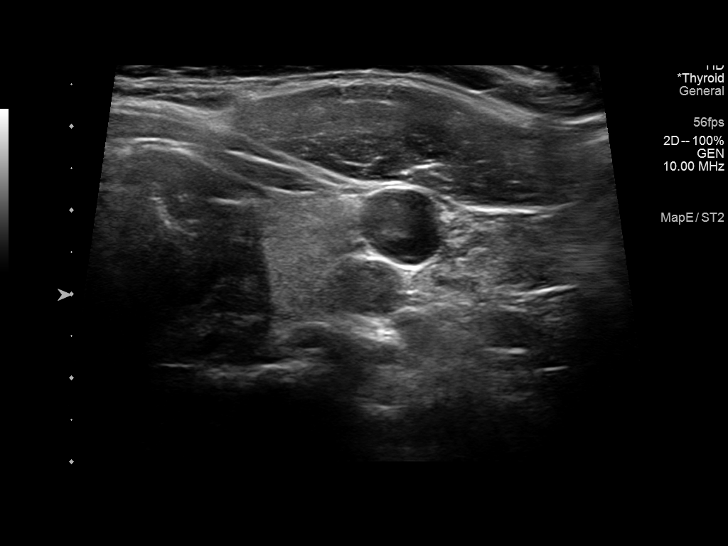
[im 31/41]
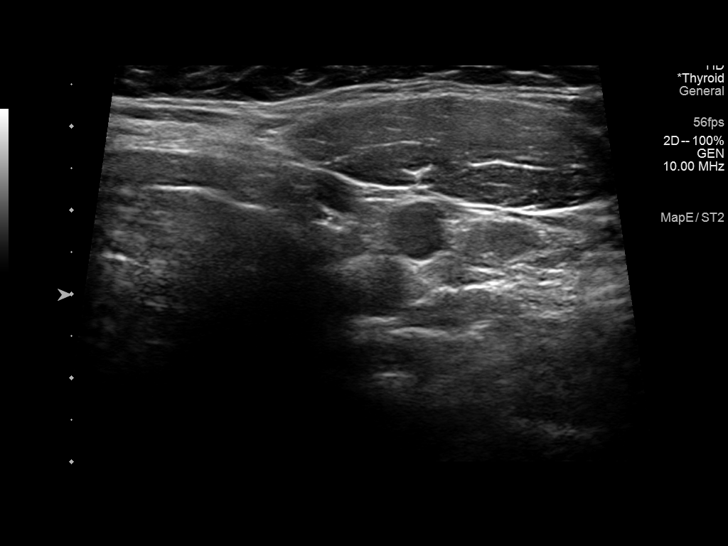
[im 34/41]
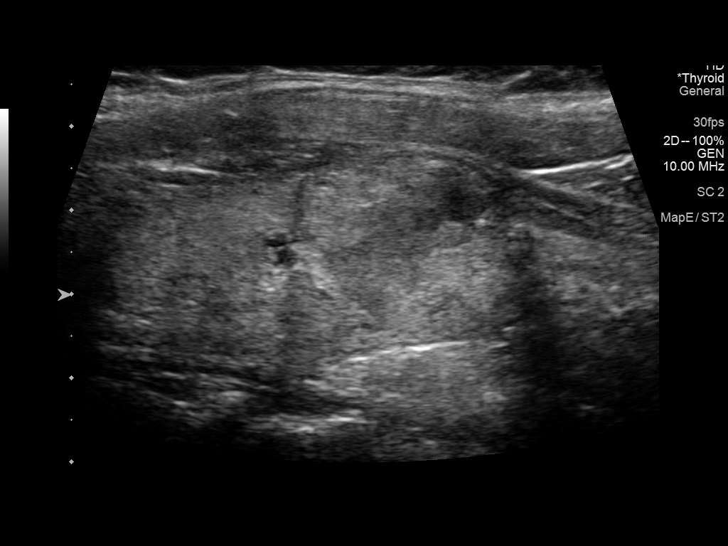
[im 37/41]
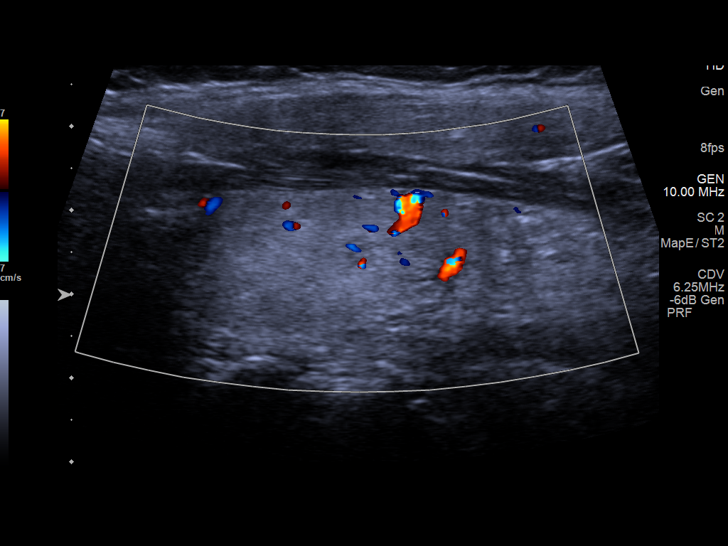
[im 41/41]
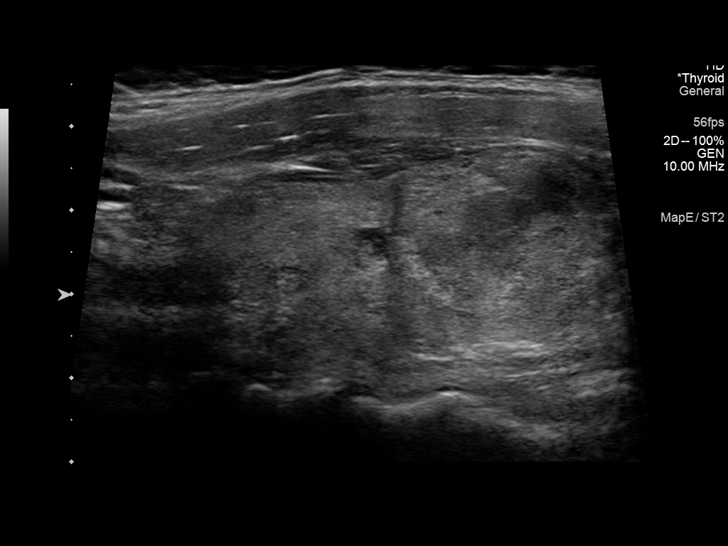

[13 of 25 positions shown; findings below may reference images not displayed]

FINDINGS: Parenchymal Echotexture: Mildly heterogenous

Isthmus: 0.6 cm

Right lobe: 5.4 x 2.3 x 2.1 cm

Left lobe: 5.5 x 1.5 x 1.5 cm

_________________________________________________________

Estimated total number of nodules >/= 1 cm: 1

Number of spongiform nodules >/=  2 cm not described below (TR1): 0

Number of mixed cystic and solid nodules >/= 1.5 cm not described
below (TR2): 0

_________________________________________________________

The previously biopsied isoechoic solid nodule in the right mid to
lower gland measures 2.6 x 2.2 x 1.9 cm, similar to perhaps slightly
enlarged compared to 2.3 x 2.0 x 1.8 cm previously.
IMPRESSION: Stable to perhaps incrementally enlarged nodule within the right mid
to lower gland. Of note, this lesion was previously biopsied in
5139. Recommend correlation with prior biopsy results.

No new or suspicious thyroid nodules are present.

The above is in keeping with the ACR TI-RADS recommendations - [HOSPITAL] 5068;[DATE].

## 2020-05-30 DIAGNOSIS — Z1231 Encounter for screening mammogram for malignant neoplasm of breast: Secondary | ICD-10-CM | POA: Diagnosis not present

## 2020-05-30 DIAGNOSIS — Z6834 Body mass index (BMI) 34.0-34.9, adult: Secondary | ICD-10-CM | POA: Diagnosis not present

## 2020-05-30 DIAGNOSIS — Z01419 Encounter for gynecological examination (general) (routine) without abnormal findings: Secondary | ICD-10-CM | POA: Diagnosis not present

## 2020-06-06 ENCOUNTER — Other Ambulatory Visit: Payer: Self-pay | Admitting: Surgery

## 2020-06-06 DIAGNOSIS — E042 Nontoxic multinodular goiter: Secondary | ICD-10-CM

## 2020-06-29 DIAGNOSIS — E559 Vitamin D deficiency, unspecified: Secondary | ICD-10-CM | POA: Diagnosis not present

## 2020-06-29 DIAGNOSIS — Z5181 Encounter for therapeutic drug level monitoring: Secondary | ICD-10-CM | POA: Diagnosis not present

## 2020-06-29 DIAGNOSIS — D509 Iron deficiency anemia, unspecified: Secondary | ICD-10-CM | POA: Diagnosis not present

## 2020-06-29 DIAGNOSIS — E1169 Type 2 diabetes mellitus with other specified complication: Secondary | ICD-10-CM | POA: Diagnosis not present

## 2020-06-29 DIAGNOSIS — I10 Essential (primary) hypertension: Secondary | ICD-10-CM | POA: Diagnosis not present

## 2020-06-29 DIAGNOSIS — R200 Anesthesia of skin: Secondary | ICD-10-CM | POA: Diagnosis not present

## 2020-07-13 DIAGNOSIS — Z13820 Encounter for screening for osteoporosis: Secondary | ICD-10-CM | POA: Diagnosis not present

## 2020-07-13 DIAGNOSIS — Z780 Asymptomatic menopausal state: Secondary | ICD-10-CM | POA: Diagnosis not present

## 2020-07-17 ENCOUNTER — Encounter: Payer: Self-pay | Admitting: Neurology

## 2020-07-17 ENCOUNTER — Ambulatory Visit: Payer: BC Managed Care – PPO | Admitting: Neurology

## 2020-07-17 VITALS — BP 135/81 | HR 91 | Ht 63.0 in | Wt 196.4 lb

## 2020-07-17 DIAGNOSIS — R200 Anesthesia of skin: Secondary | ICD-10-CM

## 2020-07-17 NOTE — Progress Notes (Signed)
Reason for visit: Right leg numbness  Referring physician: Dr. Lou Cal Amanda Underwood is a 49 y.o. female  History of present illness:  Amanda Underwood is a 49 year old right-handed black female with a history of diabetes who noted onset of low back pain in April 2021.  The patient initially had pain coming from the low back down the right thigh to the medial aspect of the right knee.  The patient had some numbness associated with the knee, eventually the back pain went away but the numbness did not.  The patient feels as if the right leg is slightly weaker than the left.  She began having some knee swelling on the right around 07 April 2020, she went to the emergency room for evaluation and no evidence of DVT was seen.  The patient reports some popping in the right knee at times.  The patient denies any pain below the knee, she denies any numbness in the feet or burning and stinging in the feet.  She does have occasional shoulder pain at night with sleeping, but she denies any neck discomfort.  She denies balance changes or difficulty controlling the bowels or the bladder.  She is sent to this office for an evaluation.  She has had an x-ray of the low back done previously, she was told she might have an L5 nerve root problem on the right.  Past Medical History:  Diagnosis Date  . Pinched nerve    lower back L5    History reviewed. No pertinent surgical history.  History reviewed. No pertinent family history.  Social history:  reports that she has never smoked. She has never used smokeless tobacco. She reports current alcohol use. She reports previous drug use.  Medications:  Prior to Admission medications   Medication Sig Start Date End Date Taking? Authorizing Provider  triamterene (DYRENIUM) 50 MG capsule Take 50 mg by mouth daily.    [provider]     No Known Allergies  ROS:  Out of a complete 14 system review of symptoms, the patient complains only of the following  symptoms, and all other reviewed systems are negative.  Knee numbness   Blood pressure 135/81, pulse 91, height 5\' 3"  (1.6 m), weight 196 lb 6.4 oz (89.1 kg).  Physical Exam  General: The patient is alert and cooperative at the time of the examination.  The patient is moderately obese.  Eyes: Pupils are equal, round, and reactive to light. Discs are flat bilaterally.  Neck: The neck is supple, no carotid bruits are noted.  Respiratory: The respiratory examination is clear.  Cardiovascular: The cardiovascular examination reveals a regular rate and rhythm, no obvious murmurs or rubs are noted.  Skin: Extremities are without significant edema.  Neurologic Exam  Mental status: The patient is alert and oriented x 3 at the time of the examination. The patient has apparent normal recent and remote memory, with an apparently normal attention span and concentration ability.  Cranial nerves: Facial symmetry is present. There is good sensation of the face to pinprick and soft touch bilaterally. The strength of the facial muscles and the muscles to head turning and shoulder shrug are normal bilaterally. Speech is well enunciated, no aphasia or dysarthria is noted. Extraocular movements are full. Visual fields are full. The tongue is midline, and the patient has symmetric elevation of the soft palate. No obvious hearing deficits are noted.  Motor: The motor testing reveals 5 over 5 strength of all 4 extremities. Good  symmetric motor tone is noted throughout.  Sensory: Sensory testing is intact to pinprick, soft touch, vibration sensation, and position sense on all 4 extremities. No evidence of extinction is noted.  Coordination: Cerebellar testing reveals good finger-nose-finger and heel-to-shin bilaterally.  Gait and station: Gait is normal. Tandem gait is normal. Romberg is negative. No drift is seen.  The patient is able to walk on heels and the toes bilaterally, she has good range move the  low back.  Reflexes: Deep tendon reflexes are symmetric and normal bilaterally. Toes are downgoing bilaterally.   Assessment/Plan:  1.  Right medial knee numbness  The patient had onset of the numbness when she had low back pain, this may be consistent with a right L3 radiculopathy.  The patient be set up for nerve conduction studies of both legs, EMG on the right leg.  She is no longer having the back pain at this time.  Amanda Palau MD 07/17/2020 2:00 PM  [facility] [address] [place], [place] [address]  Phone [phone] Fax [phone]

## 2020-07-28 DIAGNOSIS — Z03818 Encounter for observation for suspected exposure to other biological agents ruled out: Secondary | ICD-10-CM | POA: Diagnosis not present

## 2020-08-02 ENCOUNTER — Ambulatory Visit (INDEPENDENT_AMBULATORY_CARE_PROVIDER_SITE_OTHER): Payer: BC Managed Care – PPO | Admitting: Neurology

## 2020-08-02 ENCOUNTER — Encounter: Payer: Self-pay | Admitting: Neurology

## 2020-08-02 ENCOUNTER — Ambulatory Visit: Payer: BC Managed Care – PPO | Admitting: Neurology

## 2020-08-02 DIAGNOSIS — R200 Anesthesia of skin: Secondary | ICD-10-CM

## 2020-08-02 DIAGNOSIS — M5417 Radiculopathy, lumbosacral region: Secondary | ICD-10-CM | POA: Diagnosis not present

## 2020-08-02 HISTORY — DX: Radiculopathy, lumbosacral region: M54.17

## 2020-08-02 NOTE — Procedures (Signed)
     HISTORY:  Corinthia Helmers is a 50 year old patient with onset of back pain and right leg weakness and numbness that started in April 2021.  The back pain has dissipated but the medial right knee numbness has not.  The patient still feels somewhat weak in the right leg.  The patient is being evaluated for possible radiculopathy.  NERVE CONDUCTION STUDIES:  Nerve conduction studies were performed on both lower extremities.  The distal motor latencies for the peroneal and posterior tibial nerves were within normal limits bilaterally but the peroneal nerves revealed low motor amplitudes bilaterally, normal for the posterior tibial nerves bilaterally.  Some slowing was seen below the knee for the right peroneal nerve, normal above the knee.  Nerve conduction velocities were normal for the left peroneal nerve.  The nerve conduction velocities for the posterior tibial nerves were within normal limits bilaterally.  The sensory latencies for the sural and peroneal nerves were within normal limits bilaterally.  No response was seen for the peroneal F-wave latencies bilaterally, normal for the posterior tibial nerves bilaterally.  EMG STUDIES:  EMG study was performed on the rigth lower extremity:  The tibialis anterior muscle reveals 2 to 4K motor units with full recruitment. No fibrillations or positive waves were seen. The peroneus tertius muscle reveals 2 to 4K motor units with full recruitment. No fibrillations or positive waves were seen. The medial gastrocnemius muscle reveals 1 to 3K motor units with full recruitment. No fibrillations or positive waves were seen. The vastus lateralis muscle reveals 2 to 4K motor units with full recruitment. 2+ positive waves were seen. The iliopsoas muscle reveals 2 to 4K motor units with full recruitment. No fibrillations or positive waves were seen. The biceps femoris muscle (long head) reveals 2 to 4K motor units with full recruitment. No fibrillations or  positive waves were seen. The lumbosacral paraspinal muscles were tested at 3 levels, and revealed 1+ positive waves at all 3 levels tested. There was good relaxation.   IMPRESSION:  Nerve conduction studies on both lower extremities show low motor amplitude for the peroneal nerves bilaterally with sensory sparing.  EMG evaluation of the right lower extremity shows findings that are most consistent with an L3 or possibly L4 radiculopathy with acute features.  Marlan Palau MD 08/02/2020 1:54 PM  [facility] [address] [place], [place] [address]  Phone [phone] Fax [phone]

## 2020-08-02 NOTE — Progress Notes (Signed)
Please refer to EMG and nerve conduction procedure note.  

## 2020-08-02 NOTE — Progress Notes (Addendum)
The patient comes in for EMG and nerve conduction study evaluation today.  The studies suggest the possibility of an acute L3 radiculopathy.  MRI of the lumbar spine will be done.     MNC    Nerve / Sites Muscle Latency Ref. Amplitude Ref. Rel Amp Segments Distance Velocity Ref. Area    ms ms mV mV %  cm m/s m/s mVms  R Peroneal - EDB     Ankle EDB 4.4 ?6.5 0.1 ?2.0 100 Ankle - EDB 9   0.1     Fib head EDB 13.4  0.2  182 Fib head - Ankle 28 31 ?44 0.5     Pop fossa EDB 11.1  4.7  2965 Pop fossa - Fib head 10 45 ?44 19.7         Pop fossa - Ankle      L Peroneal - EDB     Ankle EDB 4.8 ?6.5 0.1 ?2.0 100 Ankle - EDB 9   0.5     Fib head EDB 11.0  6.3  7888 Fib head - Ankle 29 46 ?44 17.8     Pop fossa EDB 13.3  6.4  103 Pop fossa - Fib head 10 44 ?44 18.5         Pop fossa - Ankle      R Tibial - AH     Ankle AH 4.2 ?5.8 6.8 ?4.0 100 Ankle - AH 9   14.4     Pop fossa AH 12.9  4.6  67.7 Pop fossa - Ankle 36 42 ?41 17.6  L Tibial - AH     Ankle AH 4.0 ?5.8 4.9 ?4.0 100 Ankle - AH 9   14.9     Pop fossa AH 12.2  4.3  87.6 Pop fossa - Ankle 34 42 ?41 18.7             SNC    Nerve / Sites Rec. Site Peak Lat Ref.  Amp Ref. Segments Distance    ms ms V V  cm  R Sural - Ankle (Calf)     Calf Ankle 3.2 ?4.4 8 ?6 Calf - Ankle 14  L Sural - Ankle (Calf)     Calf Ankle 3.8 ?4.4 10 ?6 Calf - Ankle 14  R Superficial peroneal - Ankle     Lat leg Ankle 3.5 ?4.4 2 ?6 Lat leg - Ankle 14  L Superficial peroneal - Ankle     Lat leg Ankle 3.7 ?4.4 6 ?6 Lat leg - Ankle 14             F  Wave    Nerve F Lat Ref.   ms ms  R Peroneal - EDB NR ?56.0  R Tibial - AH 51.8 ?56.0  L Peroneal - EDB NR ?56.0  L Tibial - AH 48.1 ?56.0

## 2020-08-03 ENCOUNTER — Ambulatory Visit: Payer: BC Managed Care – PPO | Admitting: Neurology

## 2020-08-03 ENCOUNTER — Telehealth: Payer: Self-pay | Admitting: Neurology

## 2020-08-03 NOTE — Telephone Encounter (Signed)
no to the covid questions MR Lumbar spine wo contrast Dr. Wendi Maya Berkley Harvey: [id number] (exp. 08/03/20 to 01/29/21). Patient is scheduled at [facility] for 08/08/20.

## 2020-08-08 ENCOUNTER — Ambulatory Visit: Payer: BC Managed Care – PPO

## 2020-08-08 ENCOUNTER — Other Ambulatory Visit: Payer: Self-pay

## 2020-08-08 DIAGNOSIS — M5417 Radiculopathy, lumbosacral region: Secondary | ICD-10-CM

## 2020-08-11 ENCOUNTER — Telehealth: Payer: Self-pay | Admitting: Neurology

## 2020-08-11 NOTE — Telephone Encounter (Signed)
I called the patient.  MRI of the lumbar spine suggests some neuroforaminal stenosis at the L3-4 level on the right which correlates with the patient's symptoms and with the EMG findings.  I suspect that the neuroforaminal stenosis is worse than what the MRI suggests.  The patient does report some mild weakness of the leg, there is acute denervation in the quadriceps muscles.  If the patient desires, we will do an epidural steroid injection at that level.  She will let me know if she wants to pursue this.    MRI lumbar 08/10/20:  IMPRESSION:   MRI lumbar spine (without) demonstrating: - At L3-4: disc bulging with mild-moderate right foraminal stenosis.

## 2020-09-26 DIAGNOSIS — E559 Vitamin D deficiency, unspecified: Secondary | ICD-10-CM | POA: Diagnosis not present

## 2020-09-26 DIAGNOSIS — E1169 Type 2 diabetes mellitus with other specified complication: Secondary | ICD-10-CM | POA: Diagnosis not present

## 2020-09-26 DIAGNOSIS — Z5181 Encounter for therapeutic drug level monitoring: Secondary | ICD-10-CM | POA: Diagnosis not present

## 2020-09-26 DIAGNOSIS — I10 Essential (primary) hypertension: Secondary | ICD-10-CM | POA: Diagnosis not present

## 2020-09-26 DIAGNOSIS — D509 Iron deficiency anemia, unspecified: Secondary | ICD-10-CM | POA: Diagnosis not present

## 2020-11-01 DIAGNOSIS — N959 Unspecified menopausal and perimenopausal disorder: Secondary | ICD-10-CM | POA: Diagnosis not present

## 2020-11-21 DIAGNOSIS — M25561 Pain in right knee: Secondary | ICD-10-CM | POA: Diagnosis not present

## 2020-11-21 DIAGNOSIS — M7989 Other specified soft tissue disorders: Secondary | ICD-10-CM | POA: Diagnosis not present

## 2020-11-21 DIAGNOSIS — M25512 Pain in left shoulder: Secondary | ICD-10-CM | POA: Diagnosis not present

## 2020-11-22 ENCOUNTER — Other Ambulatory Visit: Payer: Self-pay

## 2020-11-22 ENCOUNTER — Ambulatory Visit (HOSPITAL_COMMUNITY)
Admission: RE | Admit: 2020-11-22 | Discharge: 2020-11-22 | Disposition: A | Discharge status: Home / Self Care | Payer: BC Managed Care – PPO | Source: Ambulatory Visit | Attending: Family Medicine | Admitting: Family Medicine

## 2020-11-22 ENCOUNTER — Other Ambulatory Visit (HOSPITAL_COMMUNITY): Payer: Self-pay | Admitting: Family Medicine

## 2020-11-22 DIAGNOSIS — R609 Edema, unspecified: Secondary | ICD-10-CM

## 2020-11-22 NOTE — Progress Notes (Signed)
Left upper extremity venous duplex has been completed. Preliminary results can be found in CV Proc through chart review.  Results were given to Kristi at [facility] office.  11/22/20 1:10 PM Olen Cordial RVT

## 2020-11-28 DIAGNOSIS — M25512 Pain in left shoulder: Secondary | ICD-10-CM | POA: Diagnosis not present

## 2020-11-28 DIAGNOSIS — M25561 Pain in right knee: Secondary | ICD-10-CM | POA: Diagnosis not present

## 2021-02-06 DIAGNOSIS — D509 Iron deficiency anemia, unspecified: Secondary | ICD-10-CM | POA: Diagnosis not present

## 2021-02-06 DIAGNOSIS — E559 Vitamin D deficiency, unspecified: Secondary | ICD-10-CM | POA: Diagnosis not present

## 2021-02-06 DIAGNOSIS — E1169 Type 2 diabetes mellitus with other specified complication: Secondary | ICD-10-CM | POA: Diagnosis not present

## 2021-02-06 DIAGNOSIS — Z5181 Encounter for therapeutic drug level monitoring: Secondary | ICD-10-CM | POA: Diagnosis not present

## 2021-02-06 DIAGNOSIS — I10 Essential (primary) hypertension: Secondary | ICD-10-CM | POA: Diagnosis not present

## 2021-05-03 IMAGING — US US EXTREM LOW VENOUS*R*
1 series · 14 of 24 positions shown · non-contrast
Comparison: None.

CLINICAL DATA: Pain and swelling

EXAM:
RIGHT LOWER EXTREMITY VENOUS DOPPLER ULTRASOUND
TECHNIQUE: Gray-scale sonography with compression, as well as color and duplex
ultrasound, were performed to evaluate the deep venous system(s)
from the level of the common femoral vein through the popliteal and
proximal calf veins.

[Series 1: us extrem low venous*right* · 14 of 32 slices shown]
[im 1/32]
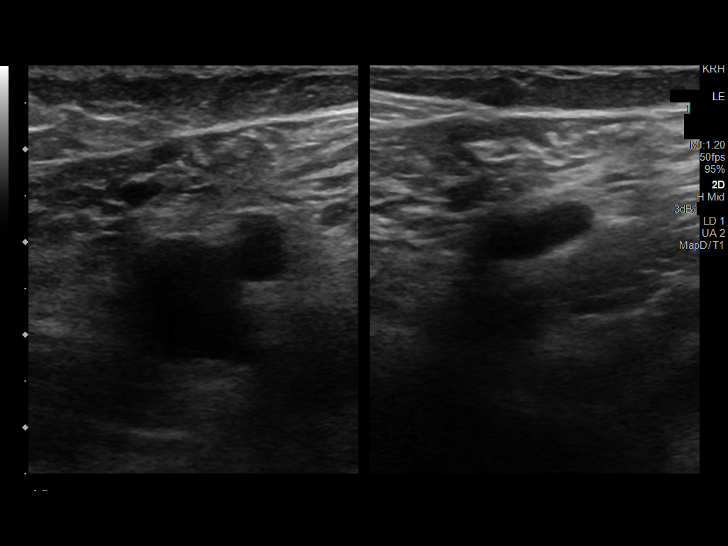
[im 3/32]
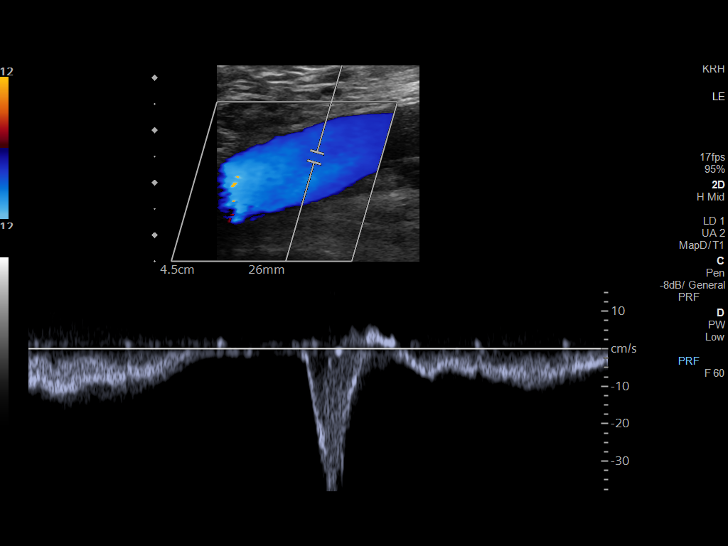
[im 6/32]
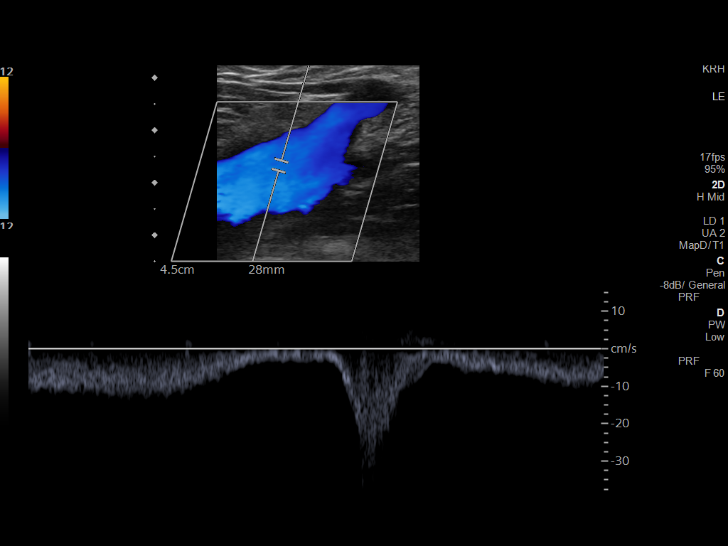
[im 9/32]
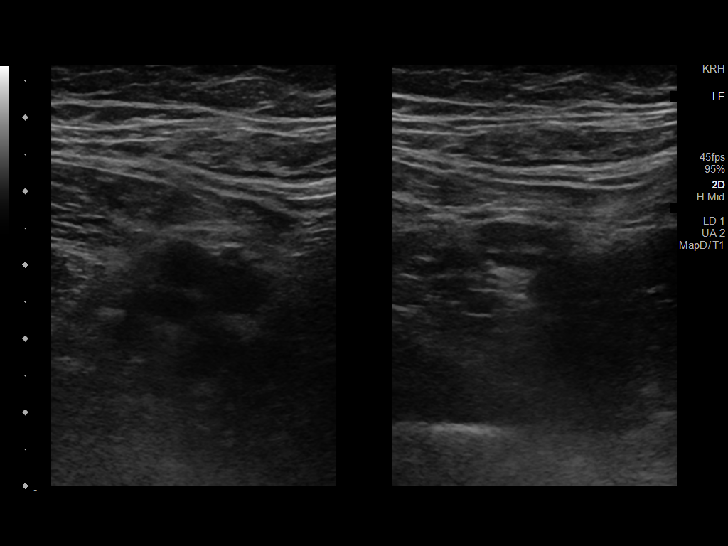
[im 10/32]
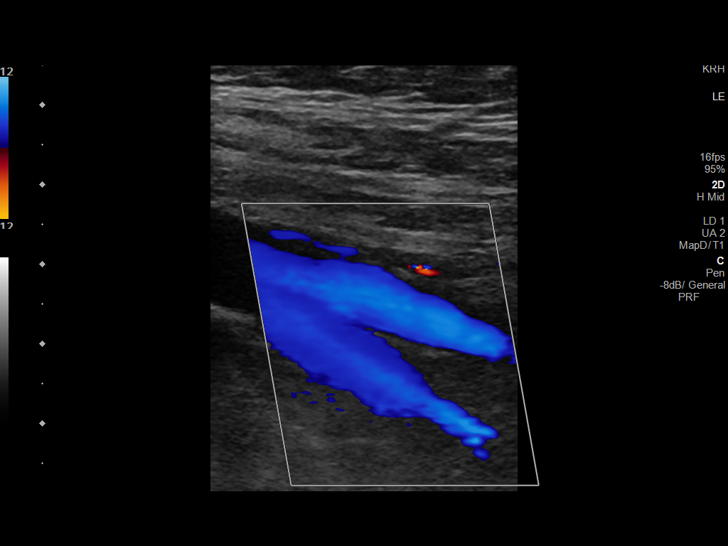
[im 13/32]
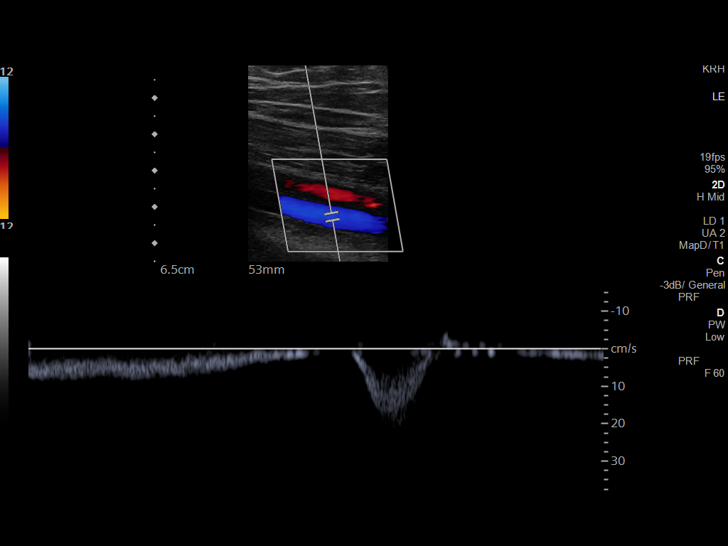
[im 15/32]
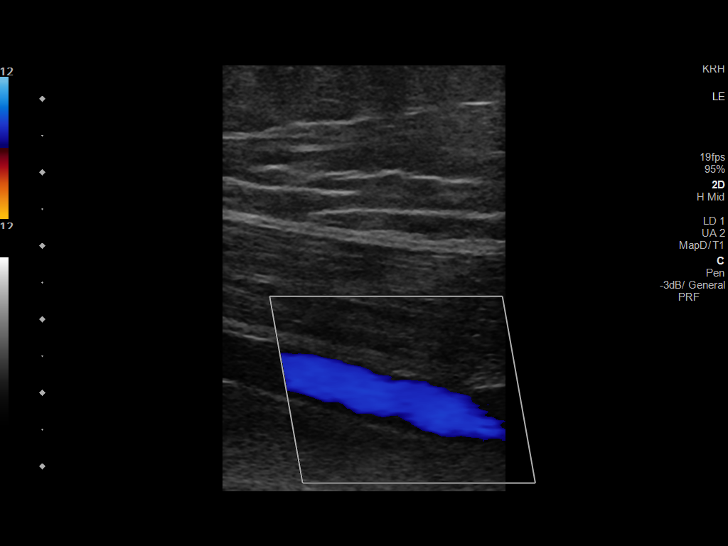
[im 17/32]
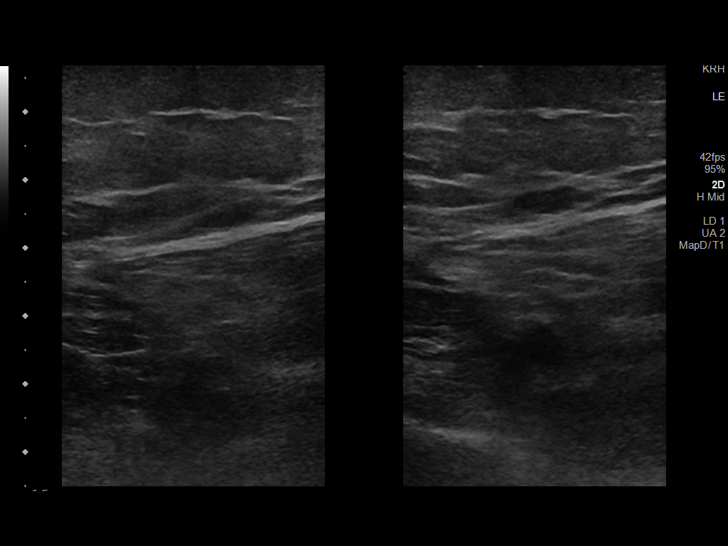
[im 19/32]
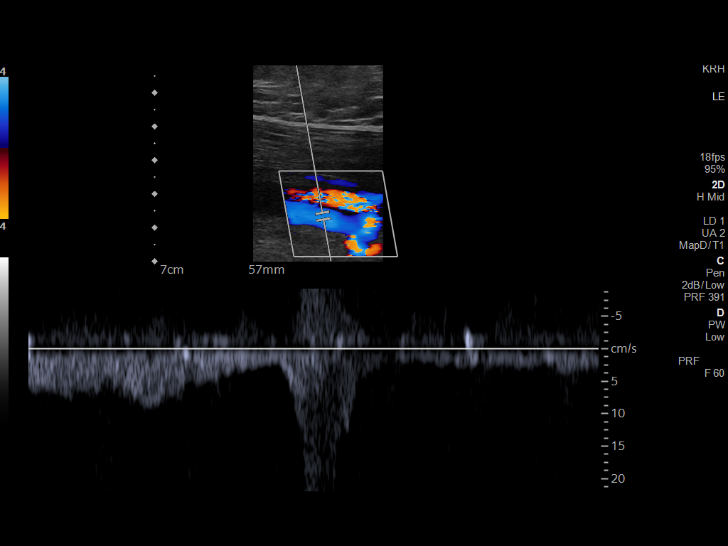
[im 22/32]
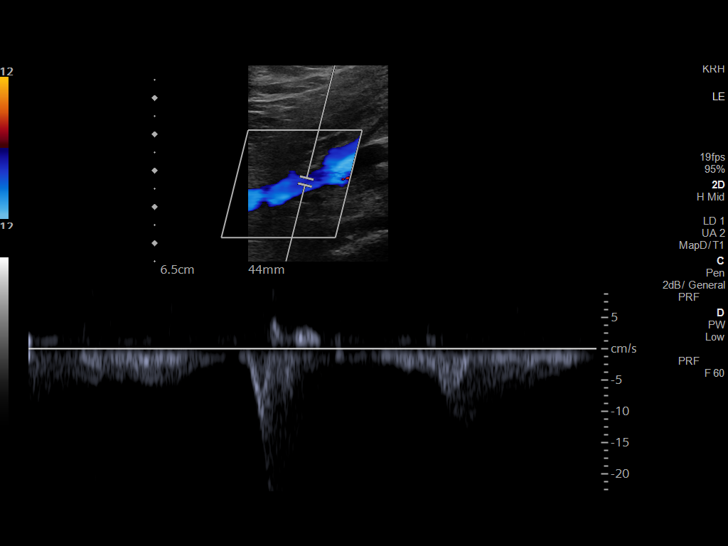
[im 25/32]
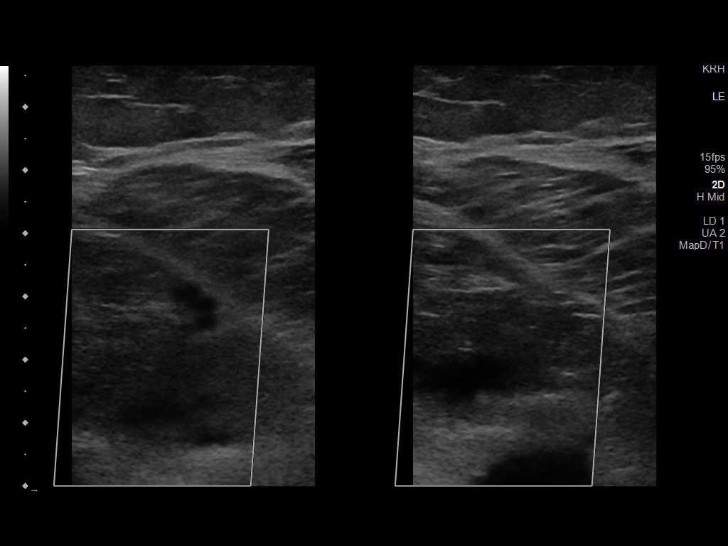
[im 26/32]
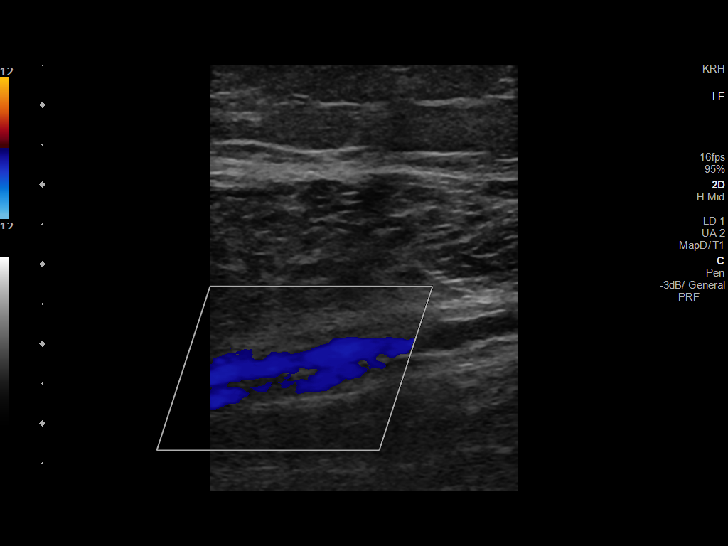
[im 29/32]
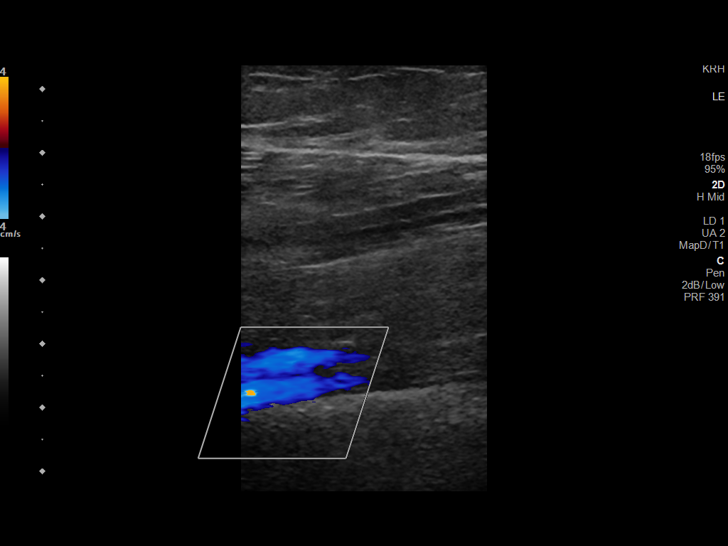
[im 32/32]
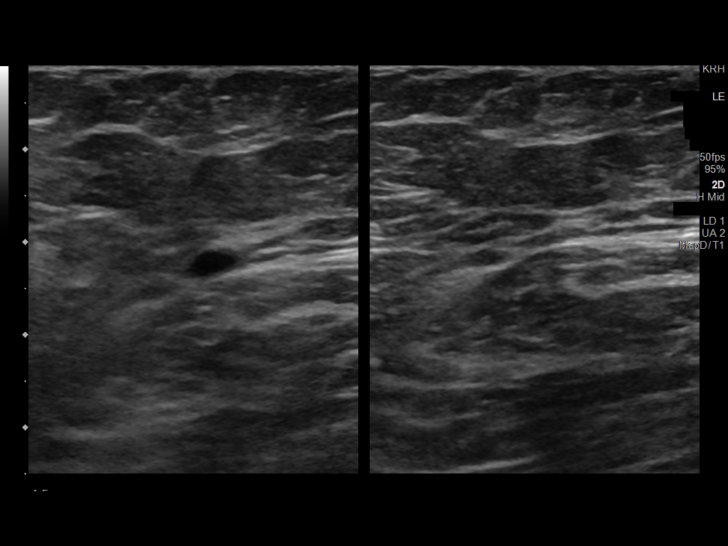

[14 of 24 positions shown; findings below may reference images not displayed]

FINDINGS: VENOUS

Normal compressibility of the common femoral, superficial femoral,
and popliteal veins, as well as the visualized calf veins.
Visualized portions of profunda femoral vein and great saphenous
vein unremarkable. No filling defects to suggest DVT on grayscale or
color Doppler imaging. Doppler waveforms show normal direction of
venous flow, normal respiratory plasticity and response to
augmentation.

Limited views of the contralateral common femoral vein are
unremarkable.

OTHER

Lower extremity edema is noted at the level of the ankle.

Limitations: none
IMPRESSION: 1. No DVT.
2. Lower extremity edema is noted at the level ankle.

## 2021-08-08 DIAGNOSIS — Z1231 Encounter for screening mammogram for malignant neoplasm of breast: Secondary | ICD-10-CM | POA: Diagnosis not present

## 2021-08-08 DIAGNOSIS — Z01419 Encounter for gynecological examination (general) (routine) without abnormal findings: Secondary | ICD-10-CM | POA: Diagnosis not present

## 2021-08-08 DIAGNOSIS — Z6836 Body mass index (BMI) 36.0-36.9, adult: Secondary | ICD-10-CM | POA: Diagnosis not present

## 2021-09-11 DIAGNOSIS — Z5181 Encounter for therapeutic drug level monitoring: Secondary | ICD-10-CM | POA: Diagnosis not present

## 2021-09-11 DIAGNOSIS — Z23 Encounter for immunization: Secondary | ICD-10-CM | POA: Diagnosis not present

## 2021-09-11 DIAGNOSIS — E1169 Type 2 diabetes mellitus with other specified complication: Secondary | ICD-10-CM | POA: Diagnosis not present

## 2021-09-11 DIAGNOSIS — D509 Iron deficiency anemia, unspecified: Secondary | ICD-10-CM | POA: Diagnosis not present

## 2021-09-11 DIAGNOSIS — E559 Vitamin D deficiency, unspecified: Secondary | ICD-10-CM | POA: Diagnosis not present

## 2021-09-11 DIAGNOSIS — I10 Essential (primary) hypertension: Secondary | ICD-10-CM | POA: Diagnosis not present

## 2021-09-25 ENCOUNTER — Other Ambulatory Visit: Payer: Self-pay | Admitting: Obstetrics and Gynecology

## 2021-09-25 DIAGNOSIS — E041 Nontoxic single thyroid nodule: Secondary | ICD-10-CM

## 2021-11-06 DIAGNOSIS — D509 Iron deficiency anemia, unspecified: Secondary | ICD-10-CM | POA: Diagnosis not present

## 2021-11-06 DIAGNOSIS — Z23 Encounter for immunization: Secondary | ICD-10-CM | POA: Diagnosis not present

## 2021-11-06 DIAGNOSIS — E559 Vitamin D deficiency, unspecified: Secondary | ICD-10-CM | POA: Diagnosis not present

## 2021-11-06 DIAGNOSIS — E1169 Type 2 diabetes mellitus with other specified complication: Secondary | ICD-10-CM | POA: Diagnosis not present

## 2021-11-06 DIAGNOSIS — I10 Essential (primary) hypertension: Secondary | ICD-10-CM | POA: Diagnosis not present

## 2022-01-01 DIAGNOSIS — R051 Acute cough: Secondary | ICD-10-CM | POA: Diagnosis not present

## 2022-01-01 DIAGNOSIS — J029 Acute pharyngitis, unspecified: Secondary | ICD-10-CM | POA: Diagnosis not present

## 2022-01-01 DIAGNOSIS — I10 Essential (primary) hypertension: Secondary | ICD-10-CM | POA: Diagnosis not present

## 2022-01-01 DIAGNOSIS — Z03818 Encounter for observation for suspected exposure to other biological agents ruled out: Secondary | ICD-10-CM | POA: Diagnosis not present

## 2022-01-01 DIAGNOSIS — R509 Fever, unspecified: Secondary | ICD-10-CM | POA: Diagnosis not present

## 2022-01-01 DIAGNOSIS — E1169 Type 2 diabetes mellitus with other specified complication: Secondary | ICD-10-CM | POA: Diagnosis not present

## 2022-03-18 DIAGNOSIS — D509 Iron deficiency anemia, unspecified: Secondary | ICD-10-CM | POA: Diagnosis not present

## 2022-03-18 DIAGNOSIS — I10 Essential (primary) hypertension: Secondary | ICD-10-CM | POA: Diagnosis not present

## 2022-03-18 DIAGNOSIS — E1169 Type 2 diabetes mellitus with other specified complication: Secondary | ICD-10-CM | POA: Diagnosis not present

## 2022-04-03 DIAGNOSIS — K635 Polyp of colon: Secondary | ICD-10-CM | POA: Diagnosis not present

## 2022-04-03 DIAGNOSIS — Z1211 Encounter for screening for malignant neoplasm of colon: Secondary | ICD-10-CM | POA: Diagnosis not present

## 2022-04-30 DIAGNOSIS — R509 Fever, unspecified: Secondary | ICD-10-CM | POA: Diagnosis not present

## 2022-04-30 DIAGNOSIS — U071 COVID-19: Secondary | ICD-10-CM | POA: Diagnosis not present

## 2022-04-30 DIAGNOSIS — R051 Acute cough: Secondary | ICD-10-CM | POA: Diagnosis not present

## 2022-04-30 DIAGNOSIS — J029 Acute pharyngitis, unspecified: Secondary | ICD-10-CM | POA: Diagnosis not present

## 2022-10-07 DIAGNOSIS — E1169 Type 2 diabetes mellitus with other specified complication: Secondary | ICD-10-CM | POA: Diagnosis not present

## 2022-10-07 DIAGNOSIS — D509 Iron deficiency anemia, unspecified: Secondary | ICD-10-CM | POA: Diagnosis not present

## 2022-10-07 DIAGNOSIS — Z0000 Encounter for general adult medical examination without abnormal findings: Secondary | ICD-10-CM | POA: Diagnosis not present

## 2022-10-07 DIAGNOSIS — I10 Essential (primary) hypertension: Secondary | ICD-10-CM | POA: Diagnosis not present

## 2022-11-01 DIAGNOSIS — I10 Essential (primary) hypertension: Secondary | ICD-10-CM | POA: Diagnosis not present

## 2022-11-01 DIAGNOSIS — L989 Disorder of the skin and subcutaneous tissue, unspecified: Secondary | ICD-10-CM | POA: Diagnosis not present

## 2023-02-27 DIAGNOSIS — D509 Iron deficiency anemia, unspecified: Secondary | ICD-10-CM | POA: Diagnosis not present

## 2023-02-27 DIAGNOSIS — Z13220 Encounter for screening for lipoid disorders: Secondary | ICD-10-CM | POA: Diagnosis not present

## 2023-02-27 DIAGNOSIS — E1169 Type 2 diabetes mellitus with other specified complication: Secondary | ICD-10-CM | POA: Diagnosis not present

## 2023-04-01 DIAGNOSIS — D509 Iron deficiency anemia, unspecified: Secondary | ICD-10-CM | POA: Diagnosis not present

## 2023-04-01 DIAGNOSIS — I10 Essential (primary) hypertension: Secondary | ICD-10-CM | POA: Diagnosis not present

## 2023-04-01 DIAGNOSIS — E1169 Type 2 diabetes mellitus with other specified complication: Secondary | ICD-10-CM | POA: Diagnosis not present

## 2023-04-01 DIAGNOSIS — E559 Vitamin D deficiency, unspecified: Secondary | ICD-10-CM | POA: Diagnosis not present

## 2023-04-01 DIAGNOSIS — Z0000 Encounter for general adult medical examination without abnormal findings: Secondary | ICD-10-CM | POA: Diagnosis not present

## 2023-04-03 ENCOUNTER — Other Ambulatory Visit: Payer: Self-pay | Admitting: Family Medicine

## 2023-04-03 DIAGNOSIS — E041 Nontoxic single thyroid nodule: Secondary | ICD-10-CM

## 2023-04-08 ENCOUNTER — Ambulatory Visit
Admission: RE | Admit: 2023-04-08 | Discharge: 2023-04-08 | Disposition: A | Discharge status: Home / Self Care | Payer: BC Managed Care – PPO | Source: Ambulatory Visit | Attending: Family Medicine | Admitting: Family Medicine

## 2023-04-08 DIAGNOSIS — E041 Nontoxic single thyroid nodule: Secondary | ICD-10-CM

## 2023-04-08 DIAGNOSIS — E042 Nontoxic multinodular goiter: Secondary | ICD-10-CM | POA: Diagnosis not present

## 2023-06-17 DIAGNOSIS — H40013 Open angle with borderline findings, low risk, bilateral: Secondary | ICD-10-CM | POA: Diagnosis not present

## 2023-09-29 DIAGNOSIS — E1169 Type 2 diabetes mellitus with other specified complication: Secondary | ICD-10-CM | POA: Diagnosis not present

## 2023-09-29 DIAGNOSIS — I10 Essential (primary) hypertension: Secondary | ICD-10-CM | POA: Diagnosis not present

## 2023-09-29 DIAGNOSIS — E041 Nontoxic single thyroid nodule: Secondary | ICD-10-CM | POA: Diagnosis not present

## 2023-09-29 DIAGNOSIS — D509 Iron deficiency anemia, unspecified: Secondary | ICD-10-CM | POA: Diagnosis not present

## 2024-03-30 DIAGNOSIS — D509 Iron deficiency anemia, unspecified: Secondary | ICD-10-CM | POA: Diagnosis not present

## 2024-03-30 DIAGNOSIS — E1169 Type 2 diabetes mellitus with other specified complication: Secondary | ICD-10-CM | POA: Diagnosis not present

## 2024-03-30 DIAGNOSIS — I10 Essential (primary) hypertension: Secondary | ICD-10-CM | POA: Diagnosis not present

## 2024-03-30 DIAGNOSIS — Z0000 Encounter for general adult medical examination without abnormal findings: Secondary | ICD-10-CM | POA: Diagnosis not present

## 2024-03-30 DIAGNOSIS — E559 Vitamin D deficiency, unspecified: Secondary | ICD-10-CM | POA: Diagnosis not present

## 2024-04-02 DIAGNOSIS — Z0000 Encounter for general adult medical examination without abnormal findings: Secondary | ICD-10-CM | POA: Diagnosis not present

## 2024-05-14 DIAGNOSIS — J069 Acute upper respiratory infection, unspecified: Secondary | ICD-10-CM | POA: Diagnosis not present

## 2024-05-14 DIAGNOSIS — Z6836 Body mass index (BMI) 36.0-36.9, adult: Secondary | ICD-10-CM | POA: Diagnosis not present
